# Patient Record
Sex: Female | Born: 1965 | Race: Black or African American | Hispanic: No | Marital: Married | Smoking: Never smoker
Health system: Southern US, Community
[De-identification: ages and names within clinical notes are randomized; demographics above are authoritative.]

## PROBLEM LIST (undated history)

## (undated) DIAGNOSIS — I10 Essential (primary) hypertension: Secondary | ICD-10-CM

## (undated) DIAGNOSIS — E785 Hyperlipidemia, unspecified: Secondary | ICD-10-CM

## (undated) HISTORY — DX: Hyperlipidemia, unspecified: E78.5

---

## 1999-05-29 ENCOUNTER — Other Ambulatory Visit: Admission: RE | Admit: 1999-05-29 | Discharge: 1999-05-29 | Payer: Self-pay | Admitting: Gynecology

## 1999-07-19 ENCOUNTER — Encounter: Payer: Self-pay | Admitting: Emergency Medicine

## 1999-07-19 ENCOUNTER — Emergency Department (HOSPITAL_COMMUNITY): Admission: EM | Admit: 1999-07-19 | Discharge: 1999-07-19 | Payer: Self-pay | Admitting: Emergency Medicine

## 1999-09-18 ENCOUNTER — Other Ambulatory Visit: Admission: RE | Admit: 1999-09-18 | Discharge: 1999-09-18 | Payer: Self-pay | Admitting: Gynecology

## 1999-10-02 ENCOUNTER — Encounter (INDEPENDENT_AMBULATORY_CARE_PROVIDER_SITE_OTHER): Payer: Self-pay | Admitting: Specialist

## 1999-10-02 ENCOUNTER — Other Ambulatory Visit: Admission: RE | Admit: 1999-10-02 | Discharge: 1999-10-02 | Payer: Self-pay | Admitting: Gynecology

## 2000-01-06 ENCOUNTER — Emergency Department (HOSPITAL_COMMUNITY): Admission: EM | Admit: 2000-01-06 | Discharge: 2000-01-06 | Payer: Self-pay | Admitting: *Deleted

## 2000-09-23 ENCOUNTER — Other Ambulatory Visit: Admission: RE | Admit: 2000-09-23 | Discharge: 2000-09-23 | Payer: Self-pay | Admitting: Gynecology

## 2003-10-19 ENCOUNTER — Other Ambulatory Visit: Admission: RE | Admit: 2003-10-19 | Discharge: 2003-10-19 | Payer: Self-pay | Admitting: Gynecology

## 2004-01-24 ENCOUNTER — Ambulatory Visit: Payer: Self-pay | Admitting: Obstetrics and Gynecology

## 2004-01-24 ENCOUNTER — Inpatient Hospital Stay (HOSPITAL_COMMUNITY): Admission: AD | Admit: 2004-01-24 | Discharge: 2004-01-24 | Payer: Self-pay | Admitting: Gynecology

## 2005-10-07 ENCOUNTER — Inpatient Hospital Stay (HOSPITAL_COMMUNITY): Admission: AD | Admit: 2005-10-07 | Discharge: 2005-10-07 | Payer: Self-pay | Admitting: *Deleted

## 2005-11-06 ENCOUNTER — Other Ambulatory Visit: Admission: RE | Admit: 2005-11-06 | Discharge: 2005-11-06 | Payer: Self-pay | Admitting: Obstetrics & Gynecology

## 2005-11-06 ENCOUNTER — Ambulatory Visit: Payer: Self-pay | Admitting: Obstetrics & Gynecology

## 2005-11-07 ENCOUNTER — Encounter (INDEPENDENT_AMBULATORY_CARE_PROVIDER_SITE_OTHER): Payer: Self-pay | Admitting: Specialist

## 2005-12-11 ENCOUNTER — Ambulatory Visit: Payer: Self-pay | Admitting: Obstetrics & Gynecology

## 2005-12-19 ENCOUNTER — Ambulatory Visit (HOSPITAL_COMMUNITY): Admission: RE | Admit: 2005-12-19 | Discharge: 2005-12-19 | Payer: Self-pay | Admitting: Obstetrics & Gynecology

## 2006-02-20 ENCOUNTER — Ambulatory Visit: Payer: Self-pay | Admitting: Obstetrics and Gynecology

## 2006-03-12 ENCOUNTER — Ambulatory Visit: Payer: Self-pay | Admitting: Obstetrics & Gynecology

## 2006-03-13 ENCOUNTER — Encounter: Payer: Self-pay | Admitting: Obstetrics & Gynecology

## 2006-03-26 ENCOUNTER — Ambulatory Visit: Payer: Self-pay | Admitting: Obstetrics & Gynecology

## 2006-07-02 ENCOUNTER — Ambulatory Visit: Payer: Self-pay | Admitting: Obstetrics & Gynecology

## 2006-07-09 ENCOUNTER — Ambulatory Visit (HOSPITAL_COMMUNITY): Admission: RE | Admit: 2006-07-09 | Discharge: 2006-07-09 | Payer: Self-pay | Admitting: Obstetrics & Gynecology

## 2006-08-13 ENCOUNTER — Ambulatory Visit: Payer: Self-pay | Admitting: Obstetrics & Gynecology

## 2009-03-23 ENCOUNTER — Emergency Department (HOSPITAL_COMMUNITY): Admission: EM | Admit: 2009-03-23 | Discharge: 2009-03-23 | Payer: Self-pay | Admitting: Emergency Medicine

## 2009-03-29 ENCOUNTER — Emergency Department (HOSPITAL_COMMUNITY): Admission: EM | Admit: 2009-03-29 | Discharge: 2009-03-29 | Payer: Self-pay | Admitting: Emergency Medicine

## 2010-06-20 ENCOUNTER — Emergency Department (HOSPITAL_COMMUNITY)
Admission: EM | Admit: 2010-06-20 | Discharge: 2010-06-20 | Payer: Self-pay | Source: Home / Self Care | Admitting: Emergency Medicine

## 2010-11-23 NOTE — Group Therapy Note (Signed)
NAMEKAYDENSE, RIZO                 ACCOUNT NO.:  [id number]   MEDICAL RECORD NO.:  [id number]          PATIENT TYPE:  WOC   LOCATION:  [facility]                   FACILITY:  [facility]   PHYSICIAN:  Elsie Lincoln, MD      DATE OF BIRTH:  1966-06-02   DATE OF SERVICE:  12/11/2005                                    CLINIC NOTE   The patient is a 45 year old female who is known to have endometrial  hyperplasia on her endometrial biopsy.  See details of the clinic note in  May for H&P.  She was already starting on Depo-Provera.  This is to be a  cure.  She needs re-sampling in 12 weeks and another shot of Depo-Provera  probably.  She is using this for birth control and control of her bleeding.  She also needs a mammogram, which was ordered today.  When we do her  endometrial biopsy, she also needs a Pap smear, and finally she needs to be  sent home with a stool guaiac kit at her next visit.           ______________________________  Elsie Lincoln, MD     KL/MEDQ  D:  12/11/2005  T:  12/12/2005  Job:  [id number]

## 2010-11-23 NOTE — Group Therapy Note (Signed)
Grace [name], BORRERO NO.:  [id number]   MEDICAL RECORD NO.:  [id number]          PATIENT TYPE:  WOC   LOCATION:  [facility]                   FACILITY:  [facility]   PHYSICIAN:  Dorthula Perfect, MD     DATE OF BIRTH:  1966/06/06   DATE OF SERVICE:                                  CLINIC NOTE   A 45 year old black female, gravida 1, para 1 returns for results.  She  was seen here the day after Christmas with a history of uterine fibroids  and metromenorrhagia.  She was cycled for the last year or two with Depo  Provera in attempt to control her bleeding.  Depo Provera was utilized  03/2006.  Vaginal bleeding could last for at least a month.  Her last  menstrual period started this past Sunday.  She is not on Depo Provera.   Ultrasound on 07/09/2006 showed a uterus of normal size.  Endometrial  was within normal limits.  There were at least 3 uterine fibroids near  the fundus.  One was a broad-based subserosal, measured 3.5 cm.  The  other 2 were anterior myometrial to the right of the midline and  measured 3.6 cm and 2.4 cm respectively.  The ovaries were normal.   The results of the ultrasound are explained to the patient.  Because  these fibroids are not particularly large and are not intramural, I  believe we can possibly help her with cyclic Provera; therefore, she is  given a prescription for Provera 10 mg, tabs 30 with the instructions to  take 1 a day for 10 days beginning on day 14 of each cycle.  She will be  rechecked again in 4 months.  If the bleeding continues to be a problem,  a hysterectomy will certainly have to be considered.           ______________________________  Dorthula Perfect, MD     ER/MEDQ  D:  08/13/2006  T:  08/13/2006  Job:  [id number]

## 2010-11-23 NOTE — Group Therapy Note (Signed)
Grace [name], Grace [name]                 ACCOUNT NO.:  [id number]   MEDICAL RECORD NO.:  [id number]          PATIENT TYPE:  WOC   LOCATION[facility]                   FACILITY:  [facility]   PHYSICIAN:  Elsie Lincoln, MD      DATE OF BIRTH:  03/16/1966   DATE OF SERVICE:  11/06/2005                                    CLINIC NOTE   HISTORY OF PRESENT ILLNESS:  The patient is a 45 year old G1, P39 female, LMP  October 08, 2005, who presents for abnormal uterine bleeding.  The patient was  seen last year by Dr. Chevis Pretty for the same problem.  She has a known fibroid  uterus.  We do not have the ultrasound, as it was done in his office.  She  has had several years of abnormal uterine bleeding where she will have 1  month of bleeding, then it stops.  She showed up at the [facility] on October 07, 2005  and found to have a hemoglobin of 8.6; she was started on ibuprofen and iron  and then followed up in the GYN clinic.  The patient has not bled since.  The patient has been on Provera in the past for these episodes of bleeding,  but has never had anything else.  Dr. Okey Dupre has attempted to start her on  birth control pills in the past; however, she refused.  She has never had an  endometrial biopsy.   PAST MEDICAL HISTORY:  Denies.   PAST SURGICAL HISTORY:  Denies.   GYNECOLOGICAL HISTORY:  NSVD x1.  She is sexually active and monogamous.  No  history of abnormal Pap smear.  No history of ovarian cyst.  Positive  history of fibroids.   ALLERGIES:  None.   MEDICATIONS:  Iron.   REVIEW OF SYSTEMS:  Positive for frequent headaches, dizzy spells, not  flashes and pain with intercourse occasionally.   SOCIAL HISTORY:  The patient is a CNA.  She smokes occasional marijuana.  She has never been physically or sexually abused.  She drinks 2 alcoholic  beverages a week and does not smoke cigarettes.  There is no history of  blood clots in her family.  Her grandmother has had lung and colon cancer.  Her mother also has  diabetes and her sister and grandmother have high blood  pressure.   PHYSICAL EXAMINATION:  PELVIC:  Tanner V.  Vulva:  No lesion.  Urethra  nontender.  Bladder nontender.  Cervix nontender.  Uterus:  Posterior  fibroid, slightly enlarged, nontender, anteverted.  Adnexa:  No masses,  nontender.   ASSESSMENT AND PLAN:  [age]-year-old female with abnormal uterine bleeding.   1.  Endometrial biopsy.  Uterus sounded to 8.5 to 9 cm.  2.  FSH today.  3.  Pap smear next visit.  4.  Patient needs mammogram, will order next visit.  5.  Patient given options for birth control pills versus Depo-Provera and      patient chooses Depo-Provera.  6.  Return to clinic in 12 weeks to see how she is doing.  7.  Get release of information from Dr. Chevis Pretty.  ______________________________  Elsie Lincoln, MD     KL/MEDQ  D:  11/06/2005  T:  11/07/2005  Job:  [id number]

## 2010-11-23 NOTE — Group Therapy Note (Signed)
NAMEMARYANA, [name]                 ACCOUNT NO.:  [id number]   MEDICAL RECORD NO.:  [id number]          PATIENT TYPE:  WOC   LOCATION:  [facility]                   FACILITY:  [facility]   PHYSICIAN:  Elsie Lincoln, MD      DATE OF BIRTH:  1966-03-19   DATE OF SERVICE:  03/12/2006                                    CLINIC NOTE   The patient is a 45 year old female who presents for repeat endometrial  biopsy, Pap smear and Depo.  She has previous history of endometrial  hyperplasia and was treated with Depo Provera.  She is here for repeat  endometrial biopsy and also needs another Depo Provera shot which she uses  for menorrhagia and fibroids.  She has had a lot of bleeding on the Depo  Provera system, may not be the best method for her birth control if the  endometrial hyperplasia  is truly resolved.  She has had a mammogram which  is negative and she has guaiac stool kit sent home.  Biopsy was performed  under sterile technique, uterus sounded to 8 cm, two passes taken.  Pap  smear was also done.  The patient is to come back in two weeks for results  and plans.           ______________________________  Elsie Lincoln, MD     KL/MEDQ  D:  03/12/2006  T:  03/13/2006  Job:  [id number]

## 2010-11-23 NOTE — Group Therapy Note (Signed)
Grace [name], Grace [name]                 ACCOUNT NO.:  [id number]   MEDICAL RECORD NO.:  [id number]          PATIENT TYPE:  WOC   LOCATION:  [facility]                   [facility]ACILITY:  [facility]   PHYSICIAN:  Dorthula Perfect, MD     DATE OF BIRTH:  1965/09/03   DATE OF SERVICE:  07/02/2006                                  CLINIC NOTE   This is a 45 year old gravida 1, para 1 has been followed this past year  with abnormal uterine bleeding.  She has been receiving Depo- Provera  every 3 months for the last year or two in an attempt to control the  bleeding.  She was seen May 2007 by Dr. Penne Lash.  Endometrial biopsy  shows simple endometrial hyperplasia without atypia.  At that time of  that endometrial biopsy the uterus sounded 8.5 to 9 cm.  She has seen  Dr. Chevis Pretty a few years ago and that is the last time that she had an  ultrasound done.  She was seen by myself August 16 for followup visit  and a repeat endometrial biopsy was done.  This revealed benign  disordered proliferative pattern.  She is here for repeat Depo-Provera.   Her previous menstrual period was October 22 until Thanksgiving.  She  started again December 13 and bleed up until just a couple of days ago.  The longest time of having vaginal bleeding would be a month and the  longest interval between the bleeding would be perhaps a few weeks.  She  uses pads.  She changes them at least 5 times a day.  She states that  the bleeding is having enough to stain her underwear and on occasion the  bed sheets.   Her only child is 74 years old and she has done nothing for birth  control ever since that time.  Certainly while on Depo-Provera she has  contraception.  When she has her bleeding she does not have any pain or  cramping.   PHYSICAL EXAMINATION:  Height 5 foot 2 inches.  Weight 171 pounds.  Blood pressure 121/76.  ABDOMEN:  Soft and nontender.  No masses are felt.  PELVIC:  External genitalia and BUS glands are normal.  Vagina well  epithelialized as is the cervix.  She is having spotting at this time.  Uterus is thought to be upper limits of normal size and no specific  uterine fibroids are noted.  Adnexal areas are normal.   IMPRESSION:  1. Uterine fibroids - by history.  2. Metromenorrhagia   DISPOSITION:  1. Ultrasound.  2. Return in 2 weeks for results.  3. Stop the Depo-Provera.  4. Consider at the next visit utilizing Provera to cycle her and      perhaps to get her to be cyclic.  I am a little      hesitant about starting oral contraception on her, but that is      another option, and of course the ultimate option if nothing else      helps would by hysterectomy.           ______________________________  Dorthula Perfect,  MD     ER/MEDQ  D:  07/02/2006  T:  07/02/2006  Job:  [id number]

## 2011-06-27 ENCOUNTER — Ambulatory Visit: Payer: Self-pay | Admitting: Obstetrics & Gynecology

## 2011-10-28 ENCOUNTER — Encounter (HOSPITAL_COMMUNITY): Payer: Self-pay

## 2011-10-28 ENCOUNTER — Emergency Department (HOSPITAL_COMMUNITY)
Admission: EM | Admit: 2011-10-28 | Discharge: 2011-10-28 | Disposition: A | Discharge status: Home / Self Care | Payer: Managed Care, Other (non HMO) | Source: Home / Self Care | Attending: Family Medicine | Admitting: Family Medicine

## 2011-10-28 DIAGNOSIS — T148XXA Other injury of unspecified body region, initial encounter: Secondary | ICD-10-CM

## 2011-10-28 DIAGNOSIS — S80869A Insect bite (nonvenomous), unspecified lower leg, initial encounter: Secondary | ICD-10-CM

## 2011-10-28 DIAGNOSIS — W57XXXA Bitten or stung by nonvenomous insect and other nonvenomous arthropods, initial encounter: Secondary | ICD-10-CM

## 2011-10-28 DIAGNOSIS — S90569A Insect bite (nonvenomous), unspecified ankle, initial encounter: Secondary | ICD-10-CM

## 2011-10-28 MED ORDER — FLUTICASONE PROPIONATE 0.05 % EX CREA: TOPICAL_CREAM | Freq: Two times a day (BID) | CUTANEOUS | Status: AC

## 2011-10-28 NOTE — Discharge Instructions (Signed)
Use cream as needed until rash resolves, return as needed.

## 2011-10-28 NOTE — ED Notes (Signed)
Pt states something bit her on the rt lower leg on Friday.  States area is red and itchy.  Reports the swelling and redness is better.

## 2011-10-28 NOTE — ED Provider Notes (Signed)
History     CSN: [id number]  Arrival date & time 10/28/11  1738   First MD Initiated Contact with Patient 10/28/11 1748      Chief Complaint  Patient presents with  . Insect Bite    (Consider location/radiation/quality/duration/timing/severity/associated sxs/prior treatment) Patient is a 46 y.o. female presenting with rash. The history is provided by the patient.  Rash  This is a new problem. The current episode started 2 days ago. The problem has not changed since onset.The problem is associated with an insect bite/sting. There has been no fever. The rash is present on the right lower leg. The patient is experiencing no pain. Associated symptoms include itching.    History reviewed. No pertinent past medical history.  History reviewed. No pertinent past surgical history.  No family history on file.  History  Substance Use Topics  . Smoking status: Never Smoker   . Smokeless tobacco: Not on file  . Alcohol Use: Yes    OB History    Grav Para Term Preterm Abortions TAB SAB Ect Mult Living                  Review of Systems  Constitutional: Negative.   Skin: Positive for itching and rash.    Allergies  Review of patient's allergies indicates no known allergies.  Home Medications   Current Outpatient Rx  Name Route Sig Dispense Refill  . FLUTICASONE PROPIONATE 0.05 % EX CREA Topical Apply topically 2 (two) times daily. 30 g 0    BP 170/88  Pulse 74  Temp(Src) 98.5 F (36.9 C) (Oral)  Resp 16  SpO2 99%  LMP 09/19/2011  Physical Exam  Nursing note and vitals reviewed. Constitutional: She appears well-developed and well-nourished.  Skin: Skin is warm and dry. Rash noted.       Erythematous patch on right calf with central clear blister, pruritic, nonpustular.     ED Course  Procedures (including critical care time)  Labs Reviewed - No data to display No results found.   1. Insect bite of lower extremity       MDM          Linna Hoff, MD 10/28/11 [phone]

## 2012-01-06 ENCOUNTER — Ambulatory Visit: Payer: Managed Care, Other (non HMO) | Admitting: Obstetrics and Gynecology

## 2012-04-27 ENCOUNTER — Other Ambulatory Visit: Payer: Self-pay | Admitting: Obstetrics and Gynecology

## 2012-04-27 ENCOUNTER — Other Ambulatory Visit (HOSPITAL_COMMUNITY)
Admission: RE | Admit: 2012-04-27 | Discharge: 2012-04-27 | Disposition: A | Discharge status: Home / Self Care | Payer: Managed Care, Other (non HMO) | Source: Ambulatory Visit | Attending: Obstetrics and Gynecology | Admitting: Obstetrics and Gynecology

## 2012-04-27 DIAGNOSIS — Z1151 Encounter for screening for human papillomavirus (HPV): Secondary | ICD-10-CM | POA: Insufficient documentation

## 2012-04-27 DIAGNOSIS — Z124 Encounter for screening for malignant neoplasm of cervix: Secondary | ICD-10-CM | POA: Insufficient documentation

## 2012-04-27 DIAGNOSIS — Z1231 Encounter for screening mammogram for malignant neoplasm of breast: Secondary | ICD-10-CM

## 2012-05-22 ENCOUNTER — Ambulatory Visit: Payer: Managed Care, Other (non HMO)

## 2012-05-26 ENCOUNTER — Encounter (HOSPITAL_COMMUNITY): Payer: Self-pay | Admitting: Emergency Medicine

## 2012-05-26 ENCOUNTER — Emergency Department (HOSPITAL_COMMUNITY)
Admission: EM | Admit: 2012-05-26 | Discharge: 2012-05-26 | Disposition: A | Discharge status: Home / Self Care | Payer: Managed Care, Other (non HMO) | Source: Home / Self Care | Attending: Emergency Medicine | Admitting: Emergency Medicine

## 2012-05-26 DIAGNOSIS — S335XXA Sprain of ligaments of lumbar spine, initial encounter: Secondary | ICD-10-CM

## 2012-05-26 DIAGNOSIS — S39012A Strain of muscle, fascia and tendon of lower back, initial encounter: Secondary | ICD-10-CM

## 2012-05-26 HISTORY — DX: Essential (primary) hypertension: I10

## 2012-05-26 MED ORDER — MELOXICAM 15 MG PO TABS: 15.0000 mg | ORAL_TABLET | Freq: Every day | ORAL | Status: DC

## 2012-05-26 MED ORDER — METHOCARBAMOL 500 MG PO TABS: 500.0000 mg | ORAL_TABLET | Freq: Three times a day (TID) | ORAL | Status: DC

## 2012-05-26 MED ORDER — TRAMADOL HCL 50 MG PO TABS: 100.0000 mg | ORAL_TABLET | Freq: Three times a day (TID) | ORAL | Status: DC | PRN

## 2012-05-26 NOTE — ED Provider Notes (Signed)
Chief Complaint  Patient presents with  . 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 This past Sunday, 3 days ago, she bent down at home and experienced sudden onset of right flank pain without radiation. The pain is worse in the morning and the evening, or with any type of movement, twisting, bending and even if she takes a deep breath. She denies any numbness or tingling in the lower extremities or bladder or bowel complaints. She denies any abdominal pain, fever, chills, headache, stiff neck, or unintentional weight loss. She has no history of cancer or osteoporosis.  Review of Systems:  Other than noted above, the patient denies any of the following symptoms: Systemic:  No fever, chills, severe fatigue, or unexplained weight loss. GI:  No abdominal pain, nausea, vomiting, diarrhea, constipation, incontinence of bowel, or blood in stool. GU:  No dysuria, frequency, urgency, or hematuria. No incontinence of urine or difficulty urinating.  M-S:  No neck pain, joint pain, arthritis, or myalgias. Neuro:  No paresthesias, saddle anesthesia, muscular weakness, or progressive neurological deficit.  PMFSH:  Past medical history, family history, social history, meds, and allergies were reviewed. Specifically, there is no history of cancer, major trauma, osteoporosis, immunosuppression, HIV, or IV or injection drug use.   Physical Exam:   Vital signs:  BP 151/97  Pulse 64  Temp 97.9 F (36.6 C) (Oral)  Resp 18  SpO2 100%  LMP 05/20/2012 General:  Alert, oriented, in no distress. Abdomen:  Soft, non-tender.  No organomegaly or mass.  No pulsatile midline abdominal mass or bruit. Back:  Exam of the lower back reveals slight pain to palpation in the right CVA area. Her back has a limited range of motion with 45 of flexion, 10 of extension, 20 of lateral bending in each direction, and 60 of rotation with pain.  Straight leg raising is negative bilaterally. Neuro:  Normal muscle strength, sensations and DTRs. Extremities: Pedal pulses were full, there was no edema. Skin:  Clear, warm and dry.  No rash.  Assessment:  The encounter diagnosis was Lumbar strain.  Plan:   1.  The following meds were prescribed:   New Prescriptions   MELOXICAM (MOBIC) 15 MG TABLET    Take 1 tablet (15 mg total) by mouth Grace [name].   METHOCARBAMOL (ROBAXIN) 500 MG TABLET    Take 1 tablet (500 mg total) by mouth 3 (three) times Grace [name].   TRAMADOL (ULTRAM) 50 MG TABLET    Take 2 tablets (100 mg total) by mouth every 8 (eight) hours as needed for pain.   2.  The patient was instructed in symptomatic care and handouts were given. 3.  The patient was told to return if becoming worse in any way, if no better in 2 weeks, and given some red flag symptoms that would indicate earlier return. 4.  The patient was encouraged to try to be as active as possible and given some exercises to do followed by moist heat.    Reuben Likes, MD 05/26/12 [phone]

## 2012-05-26 NOTE — ED Notes (Signed)
3 days ago pt states she bent down to pick something up and felt pain in the right  Mid  back area.  The pain is worse at night and in the mornings.   She denies dysuria/fever/chills

## 2012-06-10 NOTE — ED Notes (Signed)
RTW note discussion

## 2012-07-03 ENCOUNTER — Ambulatory Visit
Admission: RE | Admit: 2012-07-03 | Discharge: 2012-07-03 | Disposition: A | Discharge status: Home / Self Care | Payer: Managed Care, Other (non HMO) | Source: Ambulatory Visit | Attending: Obstetrics and Gynecology | Admitting: Obstetrics and Gynecology

## 2012-07-03 DIAGNOSIS — Z1231 Encounter for screening mammogram for malignant neoplasm of breast: Secondary | ICD-10-CM

## 2013-08-12 ENCOUNTER — Other Ambulatory Visit: Payer: Self-pay

## 2013-08-12 DIAGNOSIS — Z1231 Encounter for screening mammogram for malignant neoplasm of breast: Secondary | ICD-10-CM

## 2013-08-23 ENCOUNTER — Inpatient Hospital Stay: Admission: RE | Admit: 2013-08-23 | Payer: Managed Care, Other (non HMO) | Source: Ambulatory Visit

## 2013-08-23 ENCOUNTER — Ambulatory Visit
Admission: RE | Admit: 2013-08-23 | Discharge: 2013-08-23 | Disposition: A | Discharge status: Home / Self Care | Payer: Managed Care, Other (non HMO) | Source: Ambulatory Visit

## 2013-08-23 DIAGNOSIS — Z1231 Encounter for screening mammogram for malignant neoplasm of breast: Secondary | ICD-10-CM

## 2014-08-04 ENCOUNTER — Other Ambulatory Visit: Payer: Self-pay

## 2014-08-04 DIAGNOSIS — Z1231 Encounter for screening mammogram for malignant neoplasm of breast: Secondary | ICD-10-CM

## 2016-02-26 ENCOUNTER — Emergency Department (HOSPITAL_COMMUNITY): Payer: Self-pay

## 2016-02-26 ENCOUNTER — Encounter (HOSPITAL_COMMUNITY): Payer: Self-pay | Admitting: Emergency Medicine

## 2016-02-26 ENCOUNTER — Emergency Department (HOSPITAL_COMMUNITY)
Admission: EM | Admit: 2016-02-26 | Discharge: 2016-02-27 | Disposition: A | Discharge status: Home / Self Care | Payer: Self-pay | Attending: Physician Assistant | Admitting: Physician Assistant

## 2016-02-26 DIAGNOSIS — I10 Essential (primary) hypertension: Secondary | ICD-10-CM | POA: Insufficient documentation

## 2016-02-26 DIAGNOSIS — R42 Dizziness and giddiness: Secondary | ICD-10-CM | POA: Insufficient documentation

## 2016-02-26 DIAGNOSIS — Z7982 Long term (current) use of aspirin: Secondary | ICD-10-CM | POA: Insufficient documentation

## 2016-02-26 LAB — COMPREHENSIVE METABOLIC PANEL
ALT: 9 U/L — ABNORMAL LOW (ref 14–54)
AST: 18 U/L (ref 15–41)
Albumin: 4.5 g/dL (ref 3.5–5.0)
Alkaline Phosphatase: 52 U/L (ref 38–126)
Anion gap: 8 (ref 5–15)
BUN: 13 mg/dL (ref 6–20)
CO2: 27 mmol/L (ref 22–32)
Calcium: 9.7 mg/dL (ref 8.9–10.3)
Chloride: 106 mmol/L (ref 101–111)
Creatinine, Ser: 0.78 mg/dL (ref 0.44–1.00)
GFR calc Af Amer: >60 mL/min (ref >60)
GFR calc non Af Amer: >60 mL/min (ref >60)
Glucose, Bld: 93 mg/dL (ref 65–99)
Potassium: 3.6 mmol/L (ref 3.5–5.1)
Sodium: 141 mmol/L (ref 135–145)
Total Bilirubin: 0.7 mg/dL (ref 0.3–1.2)
Total Protein: 8.4 g/dL — ABNORMAL HIGH (ref 6.5–8.1)

## 2016-02-26 LAB — CBC WITH DIFFERENTIAL/PLATELET
Basophils Absolute: 0 10*3/uL (ref 0.0–0.1)
Basophils Relative: 0 %
Eosinophils Absolute: 0.1 10*3/uL (ref 0.0–0.7)
Eosinophils Relative: 1 %
HCT: 37.7 % (ref 36.0–46.0)
Hemoglobin: 12.4 g/dL (ref 12.0–15.0)
Lymphocytes Relative: 34 %
Lymphs Abs: 1.9 10*3/uL (ref 0.7–4.0)
MCH: 29.5 pg (ref 26.0–34.0)
MCHC: 32.9 g/dL (ref 30.0–36.0)
MCV: 89.5 fL (ref 78.0–100.0)
Monocytes Absolute: 0.4 10*3/uL (ref 0.1–1.0)
Monocytes Relative: 6 %
Neutro Abs: 3.2 10*3/uL (ref 1.7–7.7)
Neutrophils Relative %: 59 %
Platelets: 353 10*3/uL (ref 150–400)
RBC: 4.21 MIL/uL (ref 3.87–5.11)
RDW: 14.1 % (ref 11.5–15.5)
WBC: 5.5 10*3/uL (ref 4.0–10.5)

## 2016-02-26 LAB — I-STAT TROPONIN, ED: Troponin i, poc: 0 ng/mL (ref 0.00–0.08)

## 2016-02-26 MED ORDER — HYDROCHLOROTHIAZIDE 25 MG PO TABS: 25.0000 mg | ORAL_TABLET | Freq: Every day | ORAL | 0 refills | Status: DC

## 2016-02-26 NOTE — Discharge Instructions (Signed)
Please use the phone number in this packet to call for an appointmetn.  OR you can call the [facility]- they hopefully will be able to get you an appointment.  In the meantime use this medication to help with BP control.

## 2016-02-26 NOTE — ED Provider Notes (Addendum)
[facility] Provider Note   CSN: [id number] Arrival date & time: 02/26/16  2002     History   Chief Complaint Chief Complaint  Patient presents with  . Hypertension    HPI Grace [name] is a 50 y.o. female.  HPI   Patient is a 50 year old female presenting with hypertension. Patient was feeling a little bit lightheaded and went to the right aid couple times and found to have elevated blood pressure. Patient had no chest pain. Patient had no neurologic symptoms. Patient's not seen a physician a number of years.   Past Medical History:  Diagnosis Date  . Hypertension     There are no active problems to display for this patient.   History reviewed. No pertinent surgical history.  OB History    No data available       Home Medications    Prior to Admission medications   Medication Sig Start Date End Date Taking? Authorizing Provider  aspirin-acetaminophen-caffeine (EXCEDRIN MIGRAINE) [phone] MG tablet Take 1 tablet by mouth every 6 (six) hours as needed for headache.   Yes Historical Provider, MD  hydrochlorothiazide (HYDRODIURIL) 25 MG tablet Take 1 tablet (25 mg total) by mouth daily. 02/26/16   Bellatrix Devonshire Lyn Casey Fye, MD  meloxicam (MOBIC) 15 MG tablet Take 1 tablet (15 mg total) by mouth daily. Patient not taking: Reported on 02/26/2016 05/26/12   Reuben Likesavid C Keller, MD  methocarbamol (ROBAXIN) 500 MG tablet Take 1 tablet (500 mg total) by mouth 3 (three) times daily. Patient not taking: Reported on 02/26/2016 05/26/12   Reuben Likesavid C Keller, MD  traMADol (ULTRAM) 50 MG tablet Take 2 tablets (100 mg total) by mouth every 8 (eight) hours as needed for pain. Patient not taking: Reported on 02/26/2016 05/26/12   Reuben Likesavid C Keller, MD    Family History History reviewed. No pertinent family history.  Social History Social History  Substance Use Topics  . Smoking status: Never Smoker  . Smokeless tobacco: Not on file  . Alcohol use Yes     Comment: occasionally      Allergies   Review of patient's allergies indicates no known allergies.   Review of Systems Review of Systems  Constitutional: Negative for activity change.  Respiratory: Negative for shortness of breath.   Cardiovascular: Negative for chest pain.  Gastrointestinal: Negative for abdominal pain.  Neurological: Positive for light-headedness. Negative for headaches.  All other systems reviewed and are negative.    Physical Exam Updated Vital Signs BP 178/94 (BP Location: Right Arm)   Pulse 60   Temp 97.9 F (36.6 C) (Oral)   Resp 18   SpO2 100%   Physical Exam  Constitutional: She is oriented to person, place, and time. She appears well-developed and well-nourished.  HENT:  Head: Normocephalic and atraumatic.  Eyes: Right eye exhibits no discharge.  Cardiovascular: Normal rate.   Pulmonary/Chest: Effort normal. No respiratory distress.  Abdominal: Soft. There is no tenderness.  Musculoskeletal: Normal range of motion. She exhibits no edema.  Neurological: She is oriented to person, place, and time. No cranial nerve deficit.  Skin: Skin is warm and dry. She is not diaphoretic.  Psychiatric: She has a normal mood and affect.  Nursing note and vitals reviewed.    ED Treatments / Results  Labs (all labs ordered are listed, but only abnormal results are displayed) Labs Reviewed  COMPREHENSIVE METABOLIC PANEL - Abnormal; Notable for the following:       Result Value   Total Protein 8.4 (*)  ALT 9 (*)    All other components within normal limits  CBC WITH DIFFERENTIAL/PLATELET  Rosezena SensorI-STAT TROPOININ, ED    EKG  EKG Interpretation  Date/Time:  Monday February 26 2016 22:21:40 EDT Ventricular Rate:  64 PR Interval:    QRS Duration: 161 QT Interval:  487 QTC Calculation: 499 R Axis:   30 Text Interpretation:  Sinus or ectopic atrial rhythm Nonspecific intraventricular conduction delay Repol abnrm suggests ischemia, lateral leads RBBB similar to previous.   Confirmed by Kandis MannanMACKUEN, COURTNEY ([id number]) on 02/26/2016 11:14:51 PM       Radiology Ct Head Wo Contrast  Result Date: 02/26/2016 CLINICAL DATA:  50 y/o F; 3 days of hypertension with lightheadedness. A fall EXAM: CT HEAD WITHOUT CONTRAST TECHNIQUE: Contiguous axial images were obtained from the base of the skull through the vertex without intravenous contrast. COMPARISON:  CT of the head dated 03/29/2009. FINDINGS: Brain: No evidence of acute infarction, hemorrhage, hydrocephalus, extra-axial collection or mass lesion/mass effect. Cavum septum pellucidum. Vascular: No hyperdense vessel or unexpected calcification. Skull: No displaced skull fracture.  No suspicious osseous lesion. Sinuses/Orbits: Minimal mucosal thickening within the sphenoid sinus, otherwise the visualized paranasal sinuses, mastoid air cells, and orbits are unremarkable Other: None. IMPRESSION: No acute intracranial abnormality is identified. No significant interval change. Electronically Signed   By: Mitzi HansenLance  Furusawa-Stratton M.D.   On: 02/26/2016 22:48    Procedures Procedures (including critical care time)  Medications Ordered in ED Medications - No data to display   Initial Impression / Assessment and Plan / ED Course  I have reviewed the triage vital signs and the nursing notes.  Pertinent labs & imaging results that were available during my care of the patient were reviewed by me and considered in my medical decision making (see chart for details).  Clinical Course   Patient is a 50 year old female presenting with mild lightheadedness and hypertension. Patient otherwise is asymptomatic. Patient has no cranial nerve dysfunction. No chest pain. According to ACEP guidelines on asymptomatic hypertension will be able to discharge home with follow-up. We'll start her on a antihypertensive given that she will not have follow-up for next month.   Trop neg, no chest pain.   We'll give a phone number.  To get primary care  physician's appointment in either [facility] or other free clinic.   Patient started on Thiazide given JNC 8 hypertensive guideline   Final Clinical Impressions(s) / ED Diagnoses   Final diagnoses:  Essential hypertension    New Prescriptions New Prescriptions   HYDROCHLOROTHIAZIDE (HYDRODIURIL) 25 MG TABLET    Take 1 tablet (25 mg total) by mouth daily.     Rolf Fells Randall AnLyn Leshawn Straka, MD 02/26/16 2315    Yash Cacciola Randall AnLyn Makia Bossi, MD 02/26/16 [id number]    Abelino Derrickourteney Lyn Eugune Sine, MD 02/26/16 [id number]

## 2016-02-26 NOTE — ED Triage Notes (Signed)
Pt states that she has had hypertension x 3 days. States she does not have a hx of this but felt lightheaded and went to check it at the drugstore. Alert and oriented. Neuro intact.

## 2016-02-26 NOTE — ED Notes (Signed)
PT STS ON Saturday, HER NOSE BEGAN TO BLEED FOR APPROX 15 MIN.

## 2016-03-01 ENCOUNTER — Ambulatory Visit: Payer: Managed Care, Other (non HMO) | Attending: Internal Medicine | Admitting: Physician Assistant

## 2016-03-01 VITALS — BP 150/104 | HR 118 | Temp 98.1°F | Resp 16 | Wt 153.8 lb

## 2016-03-01 DIAGNOSIS — I10 Essential (primary) hypertension: Secondary | ICD-10-CM | POA: Diagnosis not present

## 2016-03-01 MED ORDER — HYDROCHLOROTHIAZIDE 25 MG PO TABS: 25.0000 mg | ORAL_TABLET | Freq: Every day | ORAL | 3 refills | Status: DC

## 2016-03-01 MED ORDER — ASPIRIN EC 81 MG PO TBEC: DELAYED_RELEASE_TABLET | ORAL | 1 refills | Status: DC

## 2016-03-01 NOTE — Progress Notes (Signed)
Patient ID: PATRA GHERARDI, female   DOB: 12/16/1965, 50 y.o.   MRN: [id number]   Lexxie Winberg, is a 50 y.o. female  BMW:[id number]  UVO:[id number]  DOB - [birth year]  Subjective:  Chief Complaint and HPI: Yannis Gumbs is a 50 y.o. female here today to establish care and for a follow up visit afetr being seen in the ED for uncontrolled htn.  She had not been to a doctor in years when she started checking her BP a few weeks ago when feeling lightheaded at the drug store and noticed it was elevated then decided to seek treatment in the ED.  She denies S/Sx related to the elevated BP now.  Upon chart review, it is of note that it looks as though she has had untreated htn for years.  This is her first time to be started on meds and was started on HCTZ at the ED visit.  ED/Hospital notes, labs, EKG reviewed.  Cardiac w/up negative for acute ischemia.   ROS:   Constitutional:  No f/c, No night sweats, No unexplained weight loss. EENT:  No vision changes, No blurry vision, No hearing changes. No mouth, throat, or ear problems.  Respiratory: No cough, No SOB Cardiac: No CP, no palpitations GI:  No abd pain, No N/V/D. GU: No Urinary s/sx Musculoskeletal: No joint pain Neuro: No headache, no dizziness, no motor weakness.  Skin: No rash Endocrine:  No polydipsia. No polyuria.  Psych: Denies SI/HI  No problems updated.  ALLERGIES: No Known Allergies  PAST MEDICAL HISTORY: Past Medical History:  Diagnosis Date  . Hypertension     MEDICATIONS AT HOME: Prior to Admission medications   Medication Sig Start Date End Date Taking? Authorizing Provider  aspirin EC 81 MG tablet Take 1 every other day 03/01/16   Anders Simmonds, PA-C  aspirin-acetaminophen-caffeine (EXCEDRIN MIGRAINE) [phone] MG tablet Take 1 tablet by mouth every 6 (six) hours as needed for headache.    Historical Provider, MD  hydrochlorothiazide (HYDRODIURIL) 25 MG tablet Take 1 tablet (25 mg total) by mouth daily. 03/01/16    Anders Simmonds, PA-C     Objective:  EXAM:   Vitals:   03/01/16 1622 03/01/16 1651  BP: (!) 154/110 (!) 150/104  Pulse: (!) 118   Resp: 16   Temp: 98.1 F (36.7 C)   SpO2: 98%   Weight: 153 lb 12.8 oz (69.8 kg)     General appearance : A&OX3. NAD. Non-toxic-appearing HEENT: Atraumatic and Normocephalic.  PERRLA. EOM intact.  TM clear B. Mouth-MMM, post pharynx WNL w/o erythema, No PND. Neck: supple, no JVD. No cervical lymphadenopathy. No thyromegaly Chest/Lungs:  Breathing-non-labored, Good air entry bilaterally, breath sounds normal without rales, rhonchi, or wheezing  CVS: S1 S2 regular, no murmurs, gallops, rubs  Extremities: Bilateral Lower Ext shows no edema, both legs are warm to touch with = pulse throughout Neurology:  CN II-XII grossly intact, Non focal.   Psych:  TP linear. J/I WNL. Normal speech. Appropriate eye contact and affect.  Skin:  No Rash  Data Review No results found for: HGBA1C   Assessment & Plan   1. Essential hypertension Uncontrolled but only on meds X 4 days; continue- hydrochlorothiazide (HYDRODIURIL) 25 MG tablet; Take 1 tablet (25 mg total) by mouth daily.  Dispense: 30 tablet; Refill: 3 Check BP daily and record.  Bring readings to next visit.  Take 81mg  aspirin every other day DASH diet. Patient have been counseled extensively about nutrition and exercise  Return in  about 2 weeks (around 03/15/2016) for establish with PCP and recheck BP.  The patient was given clear instructions to go to ER or return to medical center if symptoms don't improve, worsen or new problems develop. The patient verbalized understanding. The patient was told to call to get lab results if they haven't heard anything in the next week.     Georgian CoAngela McClung, PA-C [facility] [place], [place] [phone]   03/01/2016, 6:27 PM

## 2016-03-01 NOTE — Progress Notes (Signed)
Pt is in the office today for ed follow up blood pressure

## 2016-03-01 NOTE — Patient Instructions (Addendum)
Check BP daily and record.  Bring readings to next visit.   Take 81mg  aspirin every other day  DASH Eating Plan DASH stands for "Dietary Approaches to Stop Hypertension." The DASH eating plan is a healthy eating plan that has been shown to reduce high blood pressure (hypertension). Additional health benefits may include reducing the risk of type 2 diabetes mellitus, heart disease, and stroke. The DASH eating plan may also help with weight loss. WHAT DO I NEED TO KNOW ABOUT THE DASH EATING PLAN? For the DASH eating plan, you will follow these general guidelines:  Choose foods with a percent daily value for sodium of less than 5% (as listed on the food label).  Use salt-free seasonings or herbs instead of table salt or sea salt.  Check with your health care provider or pharmacist before using salt substitutes.  Eat lower-sodium products, often labeled as "lower sodium" or "no salt added."  Eat fresh foods.  Eat more vegetables, fruits, and low-fat dairy products.  Choose whole grains. Look for the word "whole" as the first word in the ingredient list.  Choose fish and skinless chicken or [place] more often than red meat. Limit fish, poultry, and meat to 6 oz (170 g) each day.  Limit sweets, desserts, sugars, and sugary drinks.  Choose heart-healthy fats.  Limit cheese to 1 oz (28 g) per day.  Eat more home-cooked food and less restaurant, buffet, and fast food.  Limit fried foods.  Cook foods using methods other than frying.  Limit canned vegetables. If you do use them, rinse them well to decrease the sodium.  When eating at a restaurant, ask that your food be prepared with less salt, or no salt if possible. WHAT FOODS CAN I EAT? Seek help from a dietitian for individual calorie needs. Grains Whole grain or whole wheat bread. Brown rice. Whole grain or whole wheat pasta. Quinoa, bulgur, and whole grain cereals. Low-sodium cereals. Corn or whole wheat flour tortillas. Whole  grain cornbread. Whole grain crackers. Low-sodium crackers. Vegetables Fresh or frozen vegetables (raw, steamed, roasted, or grilled). Low-sodium or reduced-sodium tomato and vegetable juices. Low-sodium or reduced-sodium tomato sauce and paste. Low-sodium or reduced-sodium canned vegetables.  Fruits All fresh, canned (in natural juice), or frozen fruits. Meat and Other Protein Products Ground beef (85% or leaner), grass-fed beef, or beef trimmed of fat. Skinless chicken or [place]. Ground chicken or [place]. Pork trimmed of fat. All fish and seafood. Eggs. Dried beans, peas, or lentils. Unsalted nuts and seeds. Unsalted canned beans. Dairy Low-fat dairy products, such as skim or 1% milk, 2% or reduced-fat cheeses, low-fat ricotta or cottage cheese, or plain low-fat yogurt. Low-sodium or reduced-sodium cheeses. Fats and Oils Tub margarines without trans fats. Light or reduced-fat mayonnaise and salad dressings (reduced sodium). Avocado. Safflower, olive, or canola oils. Natural peanut or almond butter. Other Unsalted popcorn and pretzels. The items listed above may not be a complete list of recommended foods or beverages. Contact your dietitian for more options. WHAT FOODS ARE NOT RECOMMENDED? Grains White bread. White pasta. White rice. Refined cornbread. Bagels and croissants. Crackers that contain trans fat. Vegetables Creamed or fried vegetables. Vegetables in a cheese sauce. Regular canned vegetables. Regular canned tomato sauce and paste. Regular tomato and vegetable juices. Fruits Dried fruits. Canned fruit in light or heavy syrup. Fruit juice. Meat and Other Protein Products Fatty cuts of meat. Ribs, chicken wings, bacon, sausage, bologna, salami, chitterlings, fatback, hot dogs, bratwurst, and packaged luncheon meats. Salted nuts and  seeds. Canned beans with salt. Dairy Whole or 2% milk, cream, half-and-half, and cream cheese. Whole-fat or sweetened yogurt. Full-fat cheeses or blue  cheese. Nondairy creamers and whipped toppings. Processed cheese, cheese spreads, or cheese curds. Condiments Onion and garlic salt, seasoned salt, table salt, and sea salt. Canned and packaged gravies. Worcestershire sauce. Tartar sauce. Barbecue sauce. Teriyaki sauce. Soy sauce, including reduced sodium. Steak sauce. Fish sauce. Oyster sauce. Cocktail sauce. Horseradish. Ketchup and mustard. Meat flavorings and tenderizers. Bouillon cubes. Hot sauce. Tabasco sauce. Marinades. Taco seasonings. Relishes. Fats and Oils Butter, stick margarine, lard, shortening, ghee, and bacon fat. Coconut, palm kernel, or palm oils. Regular salad dressings. Other Pickles and olives. Salted popcorn and pretzels. The items listed above may not be a complete list of foods and beverages to avoid. Contact your dietitian for more information. WHERE CAN I FIND MORE INFORMATION? National Heart, Lung, and Blood Institute: Cable[employer].itwww.nhlbi.nih.gov/health/health-topics/topics/dash/   This information is not intended to replace advice given to you by your health care provider. Make sure you discuss any questions you have with your health care provider.   Document Released: 06/13/2011 Document Revised: 07/15/2014 Document Reviewed: 04/28/2013 Elsevier Interactive Patient Education [employer]2016 Elsevier Inc.

## 2016-03-25 ENCOUNTER — Ambulatory Visit: Payer: Managed Care, Other (non HMO) | Attending: Family Medicine | Admitting: Family Medicine

## 2016-03-25 ENCOUNTER — Encounter: Payer: Self-pay | Admitting: Family Medicine

## 2016-03-25 DIAGNOSIS — I10 Essential (primary) hypertension: Secondary | ICD-10-CM | POA: Diagnosis not present

## 2016-03-25 NOTE — Progress Notes (Signed)
   Subjective:  Patient ID: Grace [name], female    DOB: 10/24/1965  Age: 50 y.o. MRN: [id number]  CC: Establish Care   HPI Grace [name] is a 50 year old female with a history of hypertension who comes into the clinic for a follow-up visit. She has been compliant with hydrochlorothiazide and OTC aspirin. Concerned when she checked her blood pressure at home it was in the 150s systolic but blood pressure in clinic is controlled today.  Past Medical History:  Diagnosis Date  . Hypertension     History reviewed. No pertinent surgical history.    Past Medical History:  Diagnosis Date  . Hypertension     History reviewed. No pertinent surgical history.   Outpatient Medications Prior to Visit  Medication Sig Dispense Refill  . aspirin EC 81 MG tablet Take 1 every other day 90 tablet 1  . aspirin-acetaminophen-caffeine (EXCEDRIN MIGRAINE) 250-250-65 MG tablet Take 1 tablet by mouth every 6 (six) hours as needed for headache.    . hydrochlorothiazide (HYDRODIURIL) 25 MG tablet Take 1 tablet (25 mg total) by mouth daily. 30 tablet 3   No facility-administered medications prior to visit.     ROS Review of Systems  Constitutional: Negative for activity change, appetite change and fatigue.  HENT: Negative for congestion, sinus pressure and sore throat.   Eyes: Negative for visual disturbance.  Respiratory: Negative for cough, chest tightness, shortness of breath and wheezing.   Cardiovascular: Negative for chest pain and palpitations.  Gastrointestinal: Negative for abdominal distention, abdominal pain and constipation.  Endocrine: Negative for polydipsia.  Genitourinary: Negative for dysuria and frequency.  Musculoskeletal: Negative for arthralgias and back pain.  Skin: Negative for rash.  Neurological: Negative for tremors, light-headedness and numbness.  Hematological: Does not bruise/bleed easily.  Psychiatric/Behavioral: Negative for agitation and behavioral problems.     Objective:  BP 128/78 (BP Location: Right Arm, Patient Position: Sitting, Cuff Size: Normal)   Pulse 96   Temp 98.2 F (36.8 C) (Oral)   Resp 18   Ht 5\' 2"  (1.575 m)   Wt 155 lb 12.8 oz (70.7 kg)   SpO2 98%   BMI 28.50 kg/m   BP/Weight 03/25/2016 03/01/2016 02/26/2016  Systolic BP 128 150 178  Diastolic BP 78 104 94  Wt. (Lbs) 155.8 153.8 -  BMI 28.5 - -      Physical Exam  Constitutional: She is oriented to person, place, and time. She appears well-developed and well-nourished.  Cardiovascular: Normal rate, normal heart sounds and intact distal pulses.   No murmur heard. Pulmonary/Chest: Effort normal and breath sounds normal. She has no wheezes. She has no rales. She exhibits no tenderness.  Abdominal: Soft. Bowel sounds are normal. She exhibits no distension and no mass. There is no tenderness.  Musculoskeletal: Normal range of motion.  Neurological: She is alert and oriented to person, place, and time.     Assessment & Plan:   1. Essential hypertension Controlled Continue hydrochlorothiazide We'll send off CMET to check for hypokalemia - COMPLETE METABOLIC PANEL WITH GFR; Future - Lipid panel; Future   No orders of the defined types were placed in this encounter.   Follow-up: Return in about 1 month (around 04/24/2016) for complete physical exam.   Jaclyn ShaggyEnobong Amao MD

## 2016-03-25 NOTE — Patient Instructions (Signed)
Hypertension Hypertension, commonly called high blood pressure, is when the force of blood pumping through your arteries is too strong. Your arteries are the blood vessels that carry blood from your heart throughout your body. A blood pressure reading consists of a higher number over a lower number, such as 110/72. The higher number (systolic) is the pressure inside your arteries when your heart pumps. The lower number (diastolic) is the pressure inside your arteries when your heart relaxes. Ideally you want your blood pressure below 120/80. Hypertension forces your heart to work harder to pump blood. Your arteries may become narrow or stiff. Having untreated or uncontrolled hypertension can cause heart attack, stroke, kidney disease, and other problems. RISK FACTORS Some risk factors for high blood pressure are controllable. Others are not.  Risk factors you cannot control include:   Race. You may be at higher risk if you are African American.  Age. Risk increases with age.  Gender. Men are at higher risk than women before age 45 years. After age 65, women are at higher risk than men. Risk factors you can control include:  Not getting enough exercise or physical activity.  Being overweight.  Getting too much fat, sugar, calories, or salt in your diet.  Drinking too much alcohol. SIGNS AND SYMPTOMS Hypertension does not usually cause signs or symptoms. Extremely high blood pressure (hypertensive crisis) may cause headache, anxiety, shortness of breath, and nosebleed. DIAGNOSIS To check if you have hypertension, your health care provider will measure your blood pressure while you are seated, with your arm held at the level of your heart. It should be measured at least twice using the same arm. Certain conditions can cause a difference in blood pressure between your right and left arms. A blood pressure reading that is higher than normal on one occasion does not mean that you need treatment. If  it is not clear whether you have high blood pressure, you may be asked to return on a different day to have your blood pressure checked again. Or, you may be asked to monitor your blood pressure at home for 1 or more weeks. TREATMENT Treating high blood pressure includes making lifestyle changes and possibly taking medicine. Living a healthy lifestyle can help lower high blood pressure. You may need to change some of your habits. Lifestyle changes may include:  Following the DASH diet. This diet is high in fruits, vegetables, and whole grains. It is low in salt, red meat, and added sugars.  Keep your sodium intake below 2,300 mg per day.  Getting at least 30-45 minutes of aerobic exercise at least 4 times per week.  Losing weight if necessary.  Not smoking.  Limiting alcoholic beverages.  Learning ways to reduce stress. Your health care provider may prescribe medicine if lifestyle changes are not enough to get your blood pressure under control, and if one of the following is true:  You are 18-59 years of age and your systolic blood pressure is above 140.  You are 60 years of age or older, and your systolic blood pressure is above 150.  Your diastolic blood pressure is above 90.  You have diabetes, and your systolic blood pressure is over 140 or your diastolic blood pressure is over 90.  You have kidney disease and your blood pressure is above 140/90.  You have heart disease and your blood pressure is above 140/90. Your personal target blood pressure may vary depending on your medical conditions, your age, and other factors. HOME CARE INSTRUCTIONS    Have your blood pressure rechecked as directed by your health care provider.   Take medicines only as directed by your health care provider. Follow the directions carefully. Blood pressure medicines must be taken as prescribed. The medicine does not work as well when you skip doses. Skipping doses also puts you at risk for  problems.  Do not smoke.   Monitor your blood pressure at home as directed by your health care provider. SEEK MEDICAL CARE IF:   You think you are having a reaction to medicines taken.  You have recurrent headaches or feel dizzy.  You have swelling in your ankles.  You have trouble with your vision. SEEK IMMEDIATE MEDICAL CARE IF:  You develop a severe headache or confusion.  You have unusual weakness, numbness, or feel faint.  You have severe chest or abdominal pain.  You vomit repeatedly.  You have trouble breathing. MAKE SURE YOU:   Understand these instructions.  Will watch your condition.  Will get help right away if you are not doing well or get worse.   This information is not intended to replace advice given to you by your health care provider. Make sure you discuss any questions you have with your health care provider.   Document Released: 06/24/2005 Document Revised: 11/08/2014 Document Reviewed: 04/16/2013 Elsevier Interactive Patient Education 2016 Elsevier Inc.  

## 2016-03-25 NOTE — Progress Notes (Signed)
Patient is here to establish care and HTN  Patient denies pain at this time.  Patient has taken medication today and patient has eaten today.  Patient declines the flu vaccine today.

## 2016-03-28 ENCOUNTER — Encounter (INDEPENDENT_AMBULATORY_CARE_PROVIDER_SITE_OTHER): Payer: Self-pay

## 2016-03-28 ENCOUNTER — Ambulatory Visit: Payer: Managed Care, Other (non HMO) | Attending: Internal Medicine

## 2016-03-28 DIAGNOSIS — I10 Essential (primary) hypertension: Secondary | ICD-10-CM

## 2016-03-28 LAB — LIPID PANEL
Cholesterol: 186 mg/dL (ref 125–200)
HDL: 48 mg/dL (ref >=46)
LDL Cholesterol: 121 mg/dL (ref <130)
Total CHOL/HDL Ratio: 3.9 Ratio (ref <=5.0)
Triglycerides: 87 mg/dL (ref <150)
VLDL: 17 mg/dL (ref <30)

## 2016-03-28 LAB — COMPLETE METABOLIC PANEL WITH GFR
ALT: 10 U/L (ref 6–29)
AST: 16 U/L (ref 10–35)
Albumin: 4.4 g/dL (ref 3.6–5.1)
Alkaline Phosphatase: 46 U/L (ref 33–130)
BUN: 12 mg/dL (ref 7–25)
CO2: 29 mmol/L (ref 20–31)
Calcium: 9.8 mg/dL (ref 8.6–10.4)
Chloride: 99 mmol/L (ref 98–110)
Creat: 0.67 mg/dL (ref 0.50–1.05)
GFR, Est African American: >89 mL/min (ref >=60)
GFR, Est Non African American: >89 mL/min (ref >=60)
Glucose, Bld: 98 mg/dL (ref 65–99)
Potassium: 3.4 mmol/L — ABNORMAL LOW (ref 3.5–5.3)
Sodium: 140 mmol/L (ref 135–146)
Total Bilirubin: 0.5 mg/dL (ref 0.2–1.2)
Total Protein: 7.2 g/dL (ref 6.1–8.1)

## 2016-03-28 NOTE — Progress Notes (Signed)
Patient here for lab work only 

## 2016-03-29 ENCOUNTER — Telehealth: Payer: Self-pay

## 2016-03-29 NOTE — Telephone Encounter (Signed)
Clld pt - advsd of lab results; Potassium level was slightly low - increase intake of Potassium rich foods such as bananas. Pt stated she understood and have no other questions at this time.

## 2016-03-29 NOTE — Telephone Encounter (Signed)
-----   Message from Jaclyn ShaggyEnobong Amao, MD sent at 03/29/2016  8:58 AM EDT ----- Potassium is slightly low; encourage intake of potassium rich foods.Lipids are normal.

## 2016-04-01 ENCOUNTER — Other Ambulatory Visit: Payer: Managed Care, Other (non HMO)

## 2016-04-05 ENCOUNTER — Ambulatory Visit: Payer: Managed Care, Other (non HMO)

## 2016-04-17 ENCOUNTER — Encounter: Payer: Managed Care, Other (non HMO) | Admitting: Family Medicine

## 2016-04-19 ENCOUNTER — Encounter: Payer: Self-pay | Admitting: Family Medicine

## 2016-04-19 ENCOUNTER — Other Ambulatory Visit (HOSPITAL_COMMUNITY): Admission: RE | Admit: 2016-04-19 | Payer: Managed Care, Other (non HMO) | Source: Ambulatory Visit

## 2016-04-19 ENCOUNTER — Ambulatory Visit: Payer: Managed Care, Other (non HMO) | Attending: Family Medicine | Admitting: Family Medicine

## 2016-04-19 ENCOUNTER — Other Ambulatory Visit: Payer: Self-pay | Admitting: Family Medicine

## 2016-04-19 VITALS — BP 122/81 | HR 81 | Temp 98.4°F | Ht 62.0 in | Wt 159.7 lb

## 2016-04-19 DIAGNOSIS — E876 Hypokalemia: Secondary | ICD-10-CM | POA: Diagnosis not present

## 2016-04-19 DIAGNOSIS — Z1231 Encounter for screening mammogram for malignant neoplasm of breast: Secondary | ICD-10-CM | POA: Diagnosis not present

## 2016-04-19 DIAGNOSIS — Z124 Encounter for screening for malignant neoplasm of cervix: Secondary | ICD-10-CM | POA: Diagnosis not present

## 2016-04-19 DIAGNOSIS — Z1239 Encounter for other screening for malignant neoplasm of breast: Secondary | ICD-10-CM

## 2016-04-19 DIAGNOSIS — Z1211 Encounter for screening for malignant neoplasm of colon: Secondary | ICD-10-CM

## 2016-04-19 DIAGNOSIS — Z0000 Encounter for general adult medical examination without abnormal findings: Secondary | ICD-10-CM

## 2016-04-19 DIAGNOSIS — N898 Other specified noninflammatory disorders of vagina: Secondary | ICD-10-CM | POA: Insufficient documentation

## 2016-04-19 DIAGNOSIS — I10 Essential (primary) hypertension: Secondary | ICD-10-CM

## 2016-04-19 DIAGNOSIS — Z7982 Long term (current) use of aspirin: Secondary | ICD-10-CM | POA: Insufficient documentation

## 2016-04-19 MED ORDER — LISINOPRIL 5 MG PO TABS: 5.0000 mg | ORAL_TABLET | Freq: Every day | ORAL | 5 refills | Status: DC

## 2016-04-19 NOTE — Patient Instructions (Signed)
Health Maintenance, Female Adopting a healthy lifestyle and getting preventive care can go a long way to promote health and wellness. Talk with your health care provider about what schedule of regular examinations is right for you. This is a good chance for you to check in with your provider about disease prevention and staying healthy. In between checkups, there are plenty of things you can do on your own. Experts have done a lot of research about which lifestyle changes and preventive measures are most likely to keep you healthy. Ask your health care provider for more information. WEIGHT AND DIET  Eat a healthy diet  Be sure to include plenty of vegetables, fruits, low-fat dairy products, and lean protein.  Do not eat a lot of foods high in solid fats, added sugars, or salt.  Get regular exercise. This is one of the most important things you can do for your health.  Most adults should exercise for at least 150 minutes each week. The exercise should increase your heart rate and make you sweat (moderate-intensity exercise).  Most adults should also do strengthening exercises at least twice a week. This is in addition to the moderate-intensity exercise.  Maintain a healthy weight  Body mass index (BMI) is a measurement that can be used to identify possible weight problems. It estimates body fat based on height and weight. Your health care provider can help determine your BMI and help you achieve or maintain a healthy weight.  For females 20 years of age and older:   A BMI below 18.5 is considered underweight.  A BMI of 18.5 to 24.9 is normal.  A BMI of 25 to 29.9 is considered overweight.  A BMI of 30 and above is considered obese.  Watch levels of cholesterol and blood lipids  You should start having your blood tested for lipids and cholesterol at 51 years of age, then have this test every 5 years.  You may need to have your cholesterol levels checked more often if:  Your lipid  or cholesterol levels are high.  You are older than 50 years of age.  You are at high risk for heart disease.  CANCER SCREENING   Lung Cancer  Lung cancer screening is recommended for adults 55-80 years old who are at high risk for lung cancer because of a history of smoking.  A yearly low-dose CT scan of the lungs is recommended for people who:  Currently smoke.  Have quit within the past 15 years.  Have at least a 30-pack-year history of smoking. A pack year is smoking an average of one pack of cigarettes a day for 1 year.  Yearly screening should continue until it has been 15 years since you quit.  Yearly screening should stop if you develop a health problem that would prevent you from having lung cancer treatment.  Breast Cancer  Practice breast self-awareness. This means understanding how your breasts normally appear and feel.  It also means doing regular breast self-exams. Let your health care provider know about any changes, no matter how small.  If you are in your 20s or 30s, you should have a clinical breast exam (CBE) by a health care provider every 1-3 years as part of a regular health exam.  If you are 40 or older, have a CBE every year. Also consider having a breast X-ray (mammogram) every year.  If you have a family history of breast cancer, talk to your health care provider about genetic screening.  If you   are at high risk for breast cancer, talk to your health care provider about having an MRI and a mammogram every year.  Breast cancer gene (BRCA) assessment is recommended for women who have family members with BRCA-related cancers. BRCA-related cancers include:  Breast.  Ovarian.  Tubal.  Peritoneal cancers.  Results of the assessment will determine the need for genetic counseling and BRCA1 and BRCA2 testing. Cervical Cancer Your health care provider may recommend that you be screened regularly for cancer of the pelvic organs (ovaries, uterus, and  vagina). This screening involves a pelvic examination, including checking for microscopic changes to the surface of your cervix (Pap test). You may be encouraged to have this screening done every 3 years, beginning at age 21.  For women ages 30-65, health care providers may recommend pelvic exams and Pap testing every 3 years, or they may recommend the Pap and pelvic exam, combined with testing for human papilloma virus (HPV), every 5 years. Some types of HPV increase your risk of cervical cancer. Testing for HPV may also be done on women of any age with unclear Pap test results.  Other health care providers may not recommend any screening for nonpregnant women who are considered low risk for pelvic cancer and who do not have symptoms. Ask your health care provider if a screening pelvic exam is right for you.  If you have had past treatment for cervical cancer or a condition that could lead to cancer, you need Pap tests and screening for cancer for at least 20 years after your treatment. If Pap tests have been discontinued, your risk factors (such as having a new sexual partner) need to be reassessed to determine if screening should resume. Some women have medical problems that increase the chance of getting cervical cancer. In these cases, your health care provider may recommend more frequent screening and Pap tests. Colorectal Cancer  This type of cancer can be detected and often prevented.  Routine colorectal cancer screening usually begins at 50 years of age and continues through 51 years of age.  Your health care provider may recommend screening at an earlier age if you have risk factors for colon cancer.  Your health care provider may also recommend using home test kits to check for hidden blood in the stool.  A small camera at the end of a tube can be used to examine your colon directly (sigmoidoscopy or colonoscopy). This is done to check for the earliest forms of colorectal  cancer.  Routine screening usually begins at age 50.  Direct examination of the colon should be repeated every 5-10 years through 51 years of age. However, you may need to be screened more often if early forms of precancerous polyps or small growths are found. Skin Cancer  Check your skin from head to toe regularly.  Tell your health care provider about any new moles or changes in moles, especially if there is a change in a mole's shape or color.  Also tell your health care provider if you have a mole that is larger than the size of a pencil eraser.  Always use sunscreen. Apply sunscreen liberally and repeatedly throughout the day.  Protect yourself by wearing long sleeves, pants, a wide-brimmed hat, and sunglasses whenever you are outside. HEART DISEASE, DIABETES, AND HIGH BLOOD PRESSURE   High blood pressure causes heart disease and increases the risk of stroke. High blood pressure is more likely to develop in:  People who have blood pressure in the high end   of the normal range (130-139/85-89 mm Hg).  People who are overweight or obese.  People who are African American.  If you are 38-23 years of age, have your blood pressure checked every 3-5 years. If you are 61 years of age or older, have your blood pressure checked every year. You should have your blood pressure measured twice--once when you are at a hospital or clinic, and once when you are not at a hospital or clinic. Record the average of the two measurements. To check your blood pressure when you are not at a hospital or clinic, you can use:  An automated blood pressure machine at a pharmacy.  A home blood pressure monitor.  If you are between 45 years and 39 years old, ask your health care provider if you should take aspirin to prevent strokes.  Have regular diabetes screenings. This involves taking a blood sample to check your fasting blood sugar level.  If you are at a normal weight and have a low risk for diabetes,  have this test once every three years after 51 years of age.  If you are overweight and have a high risk for diabetes, consider being tested at a younger age or more often. PREVENTING INFECTION  Hepatitis B  If you have a higher risk for hepatitis B, you should be screened for this virus. You are considered at high risk for hepatitis B if:  You were born in a country where hepatitis B is common. Ask your health care provider which countries are considered high risk.  Your parents were born in a high-risk country, and you have not been immunized against hepatitis B (hepatitis B vaccine).  You have HIV or AIDS.  You use needles to inject street drugs.  You live with someone who has hepatitis B.  You have had sex with someone who has hepatitis B.  You get hemodialysis treatment.  You take certain medicines for conditions, including cancer, organ transplantation, and autoimmune conditions. Hepatitis C  Blood testing is recommended for:  Everyone born from [birth year] through 1966.  Anyone with known risk factors for hepatitis C. Sexually transmitted infections (STIs)  You should be screened for sexually transmitted infections (STIs) including gonorrhea and chlamydia if:  You are sexually active and are younger than 51 years of age.  You are older than 51 years of age and your health care provider tells you that you are at risk for this type of infection.  Your sexual activity has changed since you were last screened and you are at an increased risk for chlamydia or gonorrhea. Ask your health care provider if you are at risk.  If you do not have HIV, but are at risk, it may be recommended that you take a prescription medicine daily to prevent HIV infection. This is called pre-exposure prophylaxis (PrEP). You are considered at risk if:  You are sexually active and do not regularly use condoms or know the HIV status of your partner(s).  You take drugs by injection.  You are sexually  active with a partner who has HIV. Talk with your health care provider about whether you are at high risk of being infected with HIV. If you choose to begin PrEP, you should first be tested for HIV. You should then be tested every 3 months for as long as you are taking PrEP.  PREGNANCY   If you are premenopausal and you may become pregnant, ask your health care provider about preconception counseling.  If you may  become pregnant, take 400 to 800 micrograms (mcg) of folic acid every day.  If you want to prevent pregnancy, talk to your health care provider about birth control (contraception). OSTEOPOROSIS AND MENOPAUSE   Osteoporosis is a disease in which the bones lose minerals and strength with aging. This can result in serious bone fractures. Your risk for osteoporosis can be identified using a bone density scan.  If you are 61 years of age or older, or if you are at risk for osteoporosis and fractures, ask your health care provider if you should be screened.  Ask your health care provider whether you should take a calcium or vitamin D supplement to lower your risk for osteoporosis.  Menopause may have certain physical symptoms and risks.  Hormone replacement therapy may reduce some of these symptoms and risks. Talk to your health care provider about whether hormone replacement therapy is right for you.  HOME CARE INSTRUCTIONS   Schedule regular health, dental, and eye exams.  Stay current with your immunizations.   Do not use any tobacco products including cigarettes, chewing tobacco, or electronic cigarettes.  If you are pregnant, do not drink alcohol.  If you are breastfeeding, limit how much and how often you drink alcohol.  Limit alcohol intake to no more than 1 drink per day for nonpregnant women. One drink equals 12 ounces of beer, 5 ounces of wine, or 1 ounces of hard liquor.  Do not use street drugs.  Do not share needles.  Ask your health care provider for help if  you need support or information about quitting drugs.  Tell your health care provider if you often feel depressed.  Tell your health care provider if you have ever been abused or do not feel safe at home.   This information is not intended to replace advice given to you by your health care provider. Make sure you discuss any questions you have with your health care provider.   Document Released: 01/07/2011 Document Revised: 07/15/2014 Document Reviewed: 05/26/2013 Elsevier Interactive Patient Education [employer].

## 2016-04-19 NOTE — Progress Notes (Signed)
Vaginal discharge

## 2016-04-19 NOTE — Progress Notes (Signed)
Subjective:  Patient ID: Grace [name], female    DOB: 18-Aug-1965  Age: 50 y.o. MRN: [id number]  CC: Annual Exam; Hypertension; and Vaginal Discharge   HPI Grace [name] presents forA complete physical exam and complains of occasional vaginal discharge with an odor but no vaginal itching.  Past Medical History:  Diagnosis Date  . Hypertension     History reviewed. No pertinent surgical history.  No Known Allergies   Outpatient Medications Prior to Visit  Medication Sig Dispense Refill  . aspirin EC 81 MG tablet Take 1 every other day 90 tablet 1  . aspirin-acetaminophen-caffeine (EXCEDRIN MIGRAINE) 250-250-65 MG tablet Take 1 tablet by mouth every 6 (six) hours as needed for headache.    . hydrochlorothiazide (HYDRODIURIL) 25 MG tablet Take 1 tablet (25 mg total) by mouth daily. 30 tablet 3   No facility-administered medications prior to visit.     ROS Review of Systems  Constitutional: Negative for activity change, appetite change and fatigue.  HENT: Negative for congestion, sinus pressure and sore throat.   Eyes: Negative for visual disturbance.  Respiratory: Negative for cough, chest tightness, shortness of breath and wheezing.   Cardiovascular: Negative for chest pain and palpitations.  Gastrointestinal: Negative for abdominal distention, abdominal pain and constipation.  Endocrine: Negative for polydipsia.  Genitourinary: Positive for vaginal discharge. Negative for dysuria and frequency.  Musculoskeletal: Negative for arthralgias and back pain.  Skin: Negative for rash.  Neurological: Negative for tremors, light-headedness and numbness.  Hematological: Does not bruise/bleed easily.  Psychiatric/Behavioral: Negative for agitation and behavioral problems.    Objective:  BP 122/81 (BP Location: Right Arm, Patient Position: Sitting, Cuff Size: Large)   Pulse 81   Temp 98.4 F (36.9 C) (Oral)   Ht 5\' 2"  (1.575 m)   Wt 159 lb 11.2 oz (72.4 kg)   SpO2 98%   BMI  29.21 kg/m   BP/Weight 04/19/2016 03/25/2016 03/01/2016  Systolic BP 122 128 150  Diastolic BP 81 78 104  Wt. (Lbs) 159.7 155.8 153.8  BMI 29.21 28.5 -      Physical Exam  Constitutional: She is oriented to person, place, and time. She appears well-developed and well-nourished. No distress.  HENT:  Head: Normocephalic.  Right Ear: External ear normal.  Left Ear: External ear normal.  Nose: Nose normal.  Mouth/Throat: Oropharynx is clear and moist.  Eyes: Conjunctivae and EOM are normal. Pupils are equal, round, and reactive to light.  Neck: Normal range of motion. No JVD present.  Cardiovascular: Normal rate, regular rhythm, normal heart sounds and intact distal pulses.  Exam reveals no gallop.   No murmur heard. Pulmonary/Chest: Effort normal and breath sounds normal. No respiratory distress. She has no wheezes. She has no rales. She exhibits no tenderness. Right breast exhibits no mass and no tenderness. Left breast exhibits no mass and no tenderness.  Abdominal: Soft. Bowel sounds are normal. She exhibits no distension and no mass. There is no tenderness.  Genitourinary:  Genitourinary Comments: Normal external genitalia, slight yellowish discharge in vagina, normal cervix, no CMT  Musculoskeletal: Normal range of motion. She exhibits no edema or tenderness.  Neurological: She is alert and oriented to person, place, and time. She has normal reflexes.  Skin: Skin is warm and dry. She is not diaphoretic.  Psychiatric: She has a normal mood and affect.     Assessment & Plan:   1. Screening for breast cancer - MM Digital Screening; Future  2. Screening for cervical cancer -  Cytology - PAP [place]  3. Hypokalemia Switched from hydrochlorothiazide to lisinopril - Basic Metabolic Panel; Future  4. Physical exam Vaginal cultures sent off for vaginal discharge and will treat accordingly  5. Essential hypertension - lisinopril (PRINIVIL,ZESTRIL) 5 MG tablet; Take 1  tablet (5 mg total) by mouth daily.  Dispense: 30 tablet; Refill: 5  6. Special screening for malignant neoplasms, colon - Ambulatory referral to Gastroenterology   Meds ordered this encounter  Medications  . lisinopril (PRINIVIL,ZESTRIL) 5 MG tablet    Sig: Take 1 tablet (5 mg total) by mouth daily.    Dispense:  30 tablet    Refill:  5    Discontinue hydrochlorothiazide    Follow-up: Return in about 6 months (around 10/18/2016) for Follow-up on hypertension.   Jaclyn Shaggy MD

## 2016-04-24 LAB — CYTOLOGY - PAP
Diagnosis: NEGATIVE
HPV: NOT DETECTED

## 2016-04-24 LAB — CERVICOVAGINAL ANCILLARY ONLY: Candida vaginitis: NEGATIVE

## 2016-04-25 ENCOUNTER — Telehealth: Payer: Self-pay

## 2016-04-25 ENCOUNTER — Other Ambulatory Visit: Payer: Self-pay | Admitting: Family Medicine

## 2016-04-25 MED ORDER — METRONIDAZOLE 0.75 % VA GEL: 1.0000 | Freq: Every day | VAGINAL | 0 refills | Status: DC

## 2016-04-25 NOTE — Telephone Encounter (Signed)
-----   Message from Jaclyn ShaggyEnobong Amao, MD sent at 04/25/2016  1:23 PM EDT ----- Vaginal culture was positive for bacterial vaginosis which is not an STD; Pap smear negative for malignancy.

## 2016-04-25 NOTE — Telephone Encounter (Signed)
Writer called patient per Dr. Venetia NightAmao and discussed her pap smear showing a BV , patient stated understanding and will pick up the prescribed medication at the pharmacy.

## 2016-05-09 ENCOUNTER — Ambulatory Visit: Payer: Self-pay | Attending: Family Medicine

## 2016-05-09 DIAGNOSIS — E876 Hypokalemia: Secondary | ICD-10-CM | POA: Insufficient documentation

## 2016-05-09 NOTE — Progress Notes (Signed)
Patient here for lab visit only 

## 2016-05-10 ENCOUNTER — Telehealth: Payer: Self-pay

## 2016-05-10 LAB — BASIC METABOLIC PANEL
BUN: 11 mg/dL (ref 7–25)
CO2: 26 mmol/L (ref 20–31)
Calcium: 9.5 mg/dL (ref 8.6–10.4)
Chloride: 106 mmol/L (ref 98–110)
Creat: 0.89 mg/dL (ref 0.50–1.05)
Glucose, Bld: 76 mg/dL (ref 65–99)
Potassium: 3.9 mmol/L (ref 3.5–5.3)
Sodium: 143 mmol/L (ref 135–146)

## 2016-05-10 NOTE — Telephone Encounter (Signed)
-----   Message from Jaclyn ShaggyEnobong Amao, MD sent at 05/10/2016  2:03 PM EDT ----- Please inform the patient that labs are normal. Thank you.

## 2016-05-10 NOTE — Telephone Encounter (Signed)
Writer called patient to discuss lab results. LVM requesting patient to return this call. 

## 2016-05-13 ENCOUNTER — Telehealth: Payer: Self-pay

## 2016-05-13 NOTE — Telephone Encounter (Signed)
Writer called patient per Dr. Venetia NightAmao regarding test results.  Patient stated understanding of results.

## 2016-05-13 NOTE — Telephone Encounter (Signed)
-----   Message from Enobong Amao, MD sent at 05/10/2016  2:03 PM EDT ----- Please inform the patient that labs are normal. Thank you. 

## 2016-05-23 ENCOUNTER — Encounter: Payer: Self-pay | Admitting: Family Medicine

## 2016-08-21 ENCOUNTER — Other Ambulatory Visit: Payer: Self-pay | Admitting: Pharmacist

## 2016-08-21 DIAGNOSIS — I10 Essential (primary) hypertension: Secondary | ICD-10-CM

## 2016-08-21 MED ORDER — LISINOPRIL 5 MG PO TABS: 5.0000 mg | ORAL_TABLET | Freq: Every day | ORAL | 0 refills | Status: DC

## 2016-09-12 ENCOUNTER — Emergency Department (HOSPITAL_COMMUNITY)
Admission: EM | Admit: 2016-09-12 | Discharge: 2016-09-12 | Disposition: A | Discharge status: Home / Self Care | Payer: Self-pay | Attending: Emergency Medicine | Admitting: Emergency Medicine

## 2016-09-12 ENCOUNTER — Encounter (HOSPITAL_COMMUNITY): Payer: Self-pay

## 2016-09-12 DIAGNOSIS — Z79899 Other long term (current) drug therapy: Secondary | ICD-10-CM | POA: Insufficient documentation

## 2016-09-12 DIAGNOSIS — I10 Essential (primary) hypertension: Secondary | ICD-10-CM | POA: Insufficient documentation

## 2016-09-12 DIAGNOSIS — Z7982 Long term (current) use of aspirin: Secondary | ICD-10-CM | POA: Insufficient documentation

## 2016-09-12 LAB — URINALYSIS, ROUTINE W REFLEX MICROSCOPIC
Bilirubin Urine: NEGATIVE
Glucose, UA: NEGATIVE mg/dL
Hgb urine dipstick: NEGATIVE
Ketones, ur: 5 mg/dL — AB
Leukocytes, UA: NEGATIVE
Nitrite: NEGATIVE
Protein, ur: NEGATIVE mg/dL
Specific Gravity, Urine: 1.008 (ref 1.005–1.030)
pH: 6 (ref 5.0–8.0)

## 2016-09-12 LAB — BASIC METABOLIC PANEL
Anion gap: 11 (ref 5–15)
BUN: 13 mg/dL (ref 6–20)
CO2: 25 mmol/L (ref 22–32)
Calcium: 10.3 mg/dL (ref 8.9–10.3)
Chloride: 104 mmol/L (ref 101–111)
Creatinine, Ser: 0.68 mg/dL (ref 0.44–1.00)
GFR calc Af Amer: >60 mL/min (ref >60)
GFR calc non Af Amer: >60 mL/min (ref >60)
Glucose, Bld: 109 mg/dL — ABNORMAL HIGH (ref 65–99)
Potassium: 3.5 mmol/L (ref 3.5–5.1)
Sodium: 140 mmol/L (ref 135–145)

## 2016-09-12 LAB — CBC WITH DIFFERENTIAL/PLATELET
Basophils Absolute: 0 10*3/uL (ref 0.0–0.1)
Basophils Relative: 0 %
Eosinophils Absolute: 0 10*3/uL (ref 0.0–0.7)
Eosinophils Relative: 1 %
HCT: 40.1 % (ref 36.0–46.0)
Hemoglobin: 13.2 g/dL (ref 12.0–15.0)
Lymphocytes Relative: 35 %
Lymphs Abs: 2.4 10*3/uL (ref 0.7–4.0)
MCH: 28.5 pg (ref 26.0–34.0)
MCHC: 32.9 g/dL (ref 30.0–36.0)
MCV: 86.6 fL (ref 78.0–100.0)
Monocytes Absolute: 0.4 10*3/uL (ref 0.1–1.0)
Monocytes Relative: 5 %
Neutro Abs: 4.1 10*3/uL (ref 1.7–7.7)
Neutrophils Relative %: 59 %
Platelets: 347 10*3/uL (ref 150–400)
RBC: 4.63 MIL/uL (ref 3.87–5.11)
RDW: 14.2 % (ref 11.5–15.5)
WBC: 6.9 10*3/uL (ref 4.0–10.5)

## 2016-09-12 LAB — I-STAT TROPONIN, ED: Troponin i, poc: 0.01 ng/mL (ref 0.00–0.08)

## 2016-09-12 MED ORDER — LISINOPRIL 10 MG PO TABS: 10.0000 mg | ORAL_TABLET | Freq: Every day | ORAL | 1 refills | Status: DC

## 2016-09-12 NOTE — ED Notes (Signed)
Pt has been having high blood pressure readings for the past week. Also c/o left sided headache.

## 2016-09-12 NOTE — ED Provider Notes (Signed)
[facility]-EMERGENCY DEPT Provider Note   CSN: [id number] Arrival date & time: 09/12/16  1903     History   Chief Complaint Chief Complaint  Patient presents with  . Hypertension    HPI Grace [name] is a 51 y.o. female.  HPI Grace [name] is a 51 y.o. female with history of hypertension, presents to emergency department complaining of a headache and elevated blood pressure. Patient states that she has had headaches for approximately week. Patient states she has been checking her blood pressure since her headache started and her blood pressure has been 180 systolic. She does report constant dull headache that is worse on the left side but states it's around the head. She denies any associated blurred vision. No nausea or vomiting. No photophobia. Denies any numbness or weakness in extremities. No difficulty with speech or ambulating. Patient takes lisinopril for her headache and has been on it for approximately half a year. She takes a very low-dose of 5 mg daily. She denies any other medications or supplements. She states she has been sleeping poorly because of the headache. She also reports that she has not been eating as well as in the past and stopped exercising recently. Denies drugs or alcohol. Denies any other complaints. Patient does have a primary care doctor but was unable to get the appointment until next month. She is here stating "I just want to make sure I don't have a stroke."  Past Medical History:  Diagnosis Date  . Hypertension     Patient Active Problem List   Diagnosis Date Noted  . Hypertension 03/25/2016    History reviewed. No pertinent surgical history.  OB History    No data available       Home Medications    Prior to Admission medications   Medication Sig Start Date End Date Taking? Authorizing Provider  aspirin EC 81 MG tablet Take 1 every other day Patient taking differently: Take 81 mg by mouth daily.  03/01/16  Yes Anders SimmondsAngela M McClung, PA-C    aspirin-acetaminophen-caffeine (EXCEDRIN MIGRAINE) [id number] MG tablet Take 1 tablet by mouth every 6 (six) hours as needed for headache.   Yes Historical Provider, MD  lisinopril (PRINIVIL,ZESTRIL) 5 MG tablet Take 1 tablet (5 mg total) by mouth daily. 08/21/16  Yes Jaclyn ShaggyEnobong Amao, MD  metroNIDAZOLE (METROGEL VAGINAL) 0.75 % vaginal gel Place 1 Applicatorful vaginally at bedtime. Patient not taking: Reported on 09/12/2016 04/25/16   Jaclyn ShaggyEnobong Amao, MD    Family History No family history on file.  Social History Social History  Substance Use Topics  . Smoking status: Never Smoker  . Smokeless tobacco: Never Used  . Alcohol use Yes     Comment: occasionally     Allergies   Patient has no known allergies.   Review of Systems Review of Systems  Constitutional: Negative for chills and fever.  HENT: Negative for congestion, postnasal drip and sinus pressure.   Respiratory: Negative for cough, chest tightness and shortness of breath.   Cardiovascular: Negative for chest pain, palpitations and leg swelling.  Gastrointestinal: Negative for abdominal pain, diarrhea, nausea and vomiting.  Genitourinary: Negative for dysuria, flank pain and pelvic pain.  Musculoskeletal: Negative for arthralgias, myalgias, neck pain and neck stiffness.  Skin: Negative for rash.  Neurological: Positive for headaches. Negative for dizziness, weakness, light-headedness and numbness.  All other systems reviewed and are negative.    Physical Exam Updated Vital Signs BP (!) 160/108 (BP Location: Left Arm)   Pulse 80  Temp 98 F (36.7 C) (Oral)   Resp 18   Wt 71.1 kg   BMI 28.68 kg/m   Physical Exam  Constitutional: She is oriented to person, place, and time. She appears well-developed and well-nourished. No distress.  HENT:  Head: Normocephalic.  Eyes: Conjunctivae are normal.  Neck: Neck supple.  Cardiovascular: Normal rate, regular rhythm and normal heart sounds.   Pulmonary/Chest: Effort normal  and breath sounds normal. No respiratory distress. She has no wheezes. She has no rales.  Abdominal: Soft. Bowel sounds are normal. She exhibits no distension. There is no tenderness. There is no rebound.  Musculoskeletal: She exhibits no edema.  Neurological: She is alert and oriented to person, place, and time.  Skin: Skin is warm and dry.  Psychiatric: She has a normal mood and affect. Her behavior is normal.  Nursing note and vitals reviewed.    ED Treatments / Results  Labs (all labs ordered are listed, but only abnormal results are displayed) Labs Reviewed  BASIC METABOLIC PANEL - Abnormal; Notable for the following:       Result Value   Glucose, Bld 109 (*)    All other components within normal limits  URINALYSIS, ROUTINE W REFLEX MICROSCOPIC - Abnormal; Notable for the following:    Color, Urine STRAW (*)    Ketones, ur 5 (*)    All other components within normal limits  CBC WITH DIFFERENTIAL/PLATELET  I-STAT TROPOININ, ED    EKG  EKG Interpretation  Date/Time:  Thursday September 12 2016 21:57:31 EST Ventricular Rate:  60 PR Interval:    QRS Duration: 158 QT Interval:  455 QTC Calculation: 455 R Axis:   -50 Text Interpretation:  Sinus rhythm Right bundle branch block No significant change since last tracing Confirmed by [facility] MD, Denny Peon ([id number]) on 09/12/2016 10:55:25 PM       Radiology No results found.  Procedures Procedures (including critical care time)  Medications Ordered in ED Medications - No data to display   Initial Impression / Assessment and Plan / ED Course  I have reviewed the triage vital signs and the nursing notes.  Pertinent labs & imaging results that were available during my care of the patient were reviewed by me and considered in my medical decision making (see chart for details).     She delivered department with dull constant headache for approximately a week and elevated blood pressures at home. Patient is on lisinopril, 5 mg.  There has not been any changes in her medications or any new stressors, however she does admit to unhealthy diet and no exercises recently. She denies any chest pain, shortness of breath, swelling. No neurological deficits. Will check basic labs, EKG, urinalysis. Blood pressure here is 160/108. Will recheck.   11:02 PM Patient's blood work all within normal. Her EKG showing right bundle branch block which is unchanged. Her blood pressure rechecked is 163/94. She is in no acute distress. Not concerning for intracranial bleed. Gradual onset of headache that has been constant for a week. Recommended taking Tylenol for her headache, and will increase her does of lisinopril to 10 mg. Discussed with Dr. Dalene Seltzer who agrees to the plan. Follow-up with family doctor. Discussed possibility of sleep apnea given fatigue, headache, elevated blood pressure, snoring at nighttime. Also discussed reducing sodium intake.   Vitals:   09/12/16 1929 09/12/16 2158 09/12/16 2200  BP: (!) 160/108 163/94 163/94  Pulse: 80 61 (!) 58  Resp: 18 14 11   Temp: 98 F (36.7 C)  97.5 F (36.4 C)  TempSrc: Oral  Oral  SpO2:  96% 99%  Weight: 71.1 kg       Final Clinical Impressions(s) / ED Diagnoses   Final diagnoses:  Hypertension, unspecified type    New Prescriptions New Prescriptions   No medications on file     Jaynie Crumble, PA-C 09/12/16 2305    Alvira Monday, MD 09/20/16 [phone]

## 2016-09-12 NOTE — Discharge Instructions (Signed)
Start taking lisinopril 10 mg daily. Take Tylenol for headache. Please follow-up with family doctor for recheck. Return if worsening symptoms

## 2016-10-14 ENCOUNTER — Telehealth: Payer: Self-pay

## 2016-10-14 NOTE — Telephone Encounter (Signed)
Called to find out if pt would like to have colonoscopy scheduled 

## 2016-10-16 ENCOUNTER — Telehealth: Payer: Self-pay | Admitting: Family Medicine

## 2016-10-16 NOTE — Telephone Encounter (Signed)
Patient returned nurse calling regarding scheduling a colonoscopy, Please follow up.

## 2016-10-29 ENCOUNTER — Telehealth: Payer: Self-pay | Admitting: Family Medicine

## 2016-10-29 NOTE — Telephone Encounter (Signed)
Pt called requesting a refill on script that she received from the ED. States that they upped the dosage of her med and only has one pill left/ Please f/u. Thank you.

## 2016-10-30 MED ORDER — LISINOPRIL 10 MG PO TABS: 10.0000 mg | ORAL_TABLET | Freq: Every day | ORAL | 3 refills | Status: DC

## 2016-10-30 NOTE — Telephone Encounter (Signed)
Refilled

## 2016-11-08 ENCOUNTER — Ambulatory Visit: Payer: Self-pay | Attending: Family Medicine | Admitting: Family Medicine

## 2016-11-08 ENCOUNTER — Encounter: Payer: Self-pay | Admitting: Family Medicine

## 2016-11-08 VITALS — BP 134/77 | HR 86 | Temp 97.6°F | Resp 16 | Ht 62.0 in | Wt 156.0 lb

## 2016-11-08 DIAGNOSIS — Z7982 Long term (current) use of aspirin: Secondary | ICD-10-CM | POA: Insufficient documentation

## 2016-11-08 DIAGNOSIS — I10 Essential (primary) hypertension: Secondary | ICD-10-CM | POA: Insufficient documentation

## 2016-11-08 MED ORDER — LISINOPRIL 10 MG PO TABS: 10.0000 mg | ORAL_TABLET | Freq: Every day | ORAL | 6 refills | Status: DC

## 2016-11-08 NOTE — Progress Notes (Signed)
Subjective:  Patient ID: Grace [name], female    DOB: April 27, 1966  Age: 51 y.o. MRN: [id number]  CC: Hypertension (follow up)   HPI Grace [name] is a 51 year old female with a history of hypertension who presents today for a follow-up on hypertension. She had an ED visit where she presented with elevated blood pressure and her lisinopril was increased from 5 mg to 10 mg.  She has been compliant with lisinopril and denies any side effects from it. She does not exercise regularly but adheres to a low sodium diet. Denies chest pains or shortness of breath.  Past Medical History:  Diagnosis Date  . Hypertension     No past surgical history on file.  No Known Allergies     Outpatient Medications Prior to Visit  Medication Sig Dispense Refill  . aspirin EC 81 MG tablet Take 1 every other day (Patient taking differently: Take 81 mg by mouth daily. ) 90 tablet 1  . aspirin-acetaminophen-caffeine (EXCEDRIN MIGRAINE) 250-250-65 MG tablet Take 1 tablet by mouth every 6 (six) hours as needed for headache.    . lisinopril (PRINIVIL,ZESTRIL) 10 MG tablet Take 1 tablet (10 mg total) by mouth daily. 30 tablet 3  . metroNIDAZOLE (METROGEL VAGINAL) 0.75 % vaginal gel Place 1 Applicatorful vaginally at bedtime. (Patient not taking: Reported on 09/12/2016) 70 g 0   No facility-administered medications prior to visit.     ROS Review of Systems  Constitutional: Negative for activity change, appetite change and fatigue.  HENT: Negative for congestion, sinus pressure and sore throat.   Eyes: Negative for visual disturbance.  Respiratory: Negative for cough, chest tightness, shortness of breath and wheezing.   Cardiovascular: Negative for chest pain and palpitations.  Gastrointestinal: Negative for abdominal distention, abdominal pain and constipation.  Endocrine: Negative for polydipsia.  Genitourinary: Negative for dysuria and frequency.  Musculoskeletal: Negative for arthralgias and back  pain.  Skin: Negative for rash.  Neurological: Negative for tremors, light-headedness and numbness.  Hematological: Does not bruise/bleed easily.  Psychiatric/Behavioral: Negative for agitation and behavioral problems.    Objective:  BP 134/77 (BP Location: Right Arm, Patient Position: Sitting, Cuff Size: Large)   Pulse 86   Temp 97.6 F (36.4 C) (Oral)   Resp 16   Ht _0  (1.575 m)   Wt 156 lb (70.8 kg)   SpO2 99%   BMI 28.53 kg/m   BP/Weight 11/08/2016 09/12/2016 41/28/7867  Systolic BP 672 094 709  Diastolic BP 77 628 81  Wt. (Lbs) 156 156.8 159.7  BMI 28.53 28.68 29.21      Physical Exam  Constitutional: She is oriented to person, place, and time. She appears well-developed and well-nourished.  Cardiovascular: Normal rate, normal heart sounds and intact distal pulses.   No murmur heard. Pulmonary/Chest: Effort normal and breath sounds normal. She has no wheezes. She has no rales. She exhibits no tenderness.  Abdominal: Soft. Bowel sounds are normal. She exhibits no distension and no mass. There is no tenderness.  Musculoskeletal: Normal range of motion.  Neurological: She is alert and oriented to person, place, and time.  Skin: Skin is warm and dry.  Psychiatric: She has a normal mood and affect.     Assessment & Plan:   1. Essential hypertension Controlled Will check potassium due to recent increase in dose Low sodium  Diet, lifestyle modifications - lisinopril (PRINIVIL,ZESTRIL) 10 MG tablet; Take 1 tablet (10 mg total) by mouth daily.  Dispense: 30 tablet; Refill: 6 - CMP14+EGFR  Meds ordered this encounter  Medications  . lisinopril (PRINIVIL,ZESTRIL) 10 MG tablet    Sig: Take 1 tablet (10 mg total) by mouth daily.    Dispense:  30 tablet    Refill:  6    Follow-up: Return in about 6 months (around 05/11/2017) for Follow-up on hypertension.   Arnoldo Morale MD

## 2016-11-08 NOTE — Patient Instructions (Signed)

## 2016-11-12 ENCOUNTER — Other Ambulatory Visit: Payer: Self-pay

## 2016-11-19 ENCOUNTER — Ambulatory Visit: Payer: Self-pay | Attending: Family Medicine

## 2016-11-20 LAB — CMP14+EGFR
ALT: 8 IU/L (ref 0–32)
AST: 11 IU/L (ref 0–40)
Albumin/Globulin Ratio: 1.6 (ref 1.2–2.2)
Albumin: 4.4 g/dL (ref 3.5–5.5)
Alkaline Phosphatase: 46 IU/L (ref 39–117)
BUN/Creatinine Ratio: 18 (ref 9–23)
BUN: 14 mg/dL (ref 6–24)
Bilirubin Total: <0.2 mg/dL (ref 0.0–1.2)
CO2: 28 mmol/L (ref 18–29)
Calcium: 10.4 mg/dL — ABNORMAL HIGH (ref 8.7–10.2)
Chloride: 101 mmol/L (ref 96–106)
Creatinine, Ser: 0.8 mg/dL (ref 0.57–1.00)
GFR calc Af Amer: 99 mL/min/{1.73_m2} (ref >59)
GFR calc non Af Amer: 86 mL/min/{1.73_m2} (ref >59)
Globulin, Total: 2.7 g/dL (ref 1.5–4.5)
Glucose: 90 mg/dL (ref 65–99)
Potassium: 4.2 mmol/L (ref 3.5–5.2)
Sodium: 144 mmol/L (ref 134–144)
Total Protein: 7.1 g/dL (ref 6.0–8.5)

## 2017-02-23 ENCOUNTER — Encounter (HOSPITAL_COMMUNITY): Payer: Self-pay

## 2017-02-23 ENCOUNTER — Emergency Department (HOSPITAL_COMMUNITY)
Admission: EM | Admit: 2017-02-23 | Discharge: 2017-02-23 | Disposition: A | Discharge status: Home / Self Care | Payer: Self-pay | Attending: Emergency Medicine | Admitting: Emergency Medicine

## 2017-02-23 DIAGNOSIS — Z7982 Long term (current) use of aspirin: Secondary | ICD-10-CM | POA: Insufficient documentation

## 2017-02-23 DIAGNOSIS — Z79899 Other long term (current) drug therapy: Secondary | ICD-10-CM | POA: Insufficient documentation

## 2017-02-23 DIAGNOSIS — G4489 Other headache syndrome: Secondary | ICD-10-CM

## 2017-02-23 DIAGNOSIS — I10 Essential (primary) hypertension: Secondary | ICD-10-CM

## 2017-02-23 LAB — CBC WITH DIFFERENTIAL/PLATELET
Basophils Absolute: 0 10*3/uL (ref 0.0–0.1)
Basophils Relative: 0 %
Eosinophils Absolute: 0.1 10*3/uL (ref 0.0–0.7)
Eosinophils Relative: 1 %
HCT: 40.9 % (ref 36.0–46.0)
Hemoglobin: 13 g/dL (ref 12.0–15.0)
Lymphocytes Relative: 41 %
Lymphs Abs: 1.9 10*3/uL (ref 0.7–4.0)
MCH: 28.6 pg (ref 26.0–34.0)
MCHC: 31.8 g/dL (ref 30.0–36.0)
MCV: 90.1 fL (ref 78.0–100.0)
Monocytes Absolute: 0.2 10*3/uL (ref 0.1–1.0)
Monocytes Relative: 5 %
Neutro Abs: 2.4 10*3/uL (ref 1.7–7.7)
Neutrophils Relative %: 53 %
Platelets: 294 10*3/uL (ref 150–400)
RBC: 4.54 MIL/uL (ref 3.87–5.11)
RDW: 14.1 % (ref 11.5–15.5)
WBC: 4.6 10*3/uL (ref 4.0–10.5)

## 2017-02-23 LAB — BASIC METABOLIC PANEL
Anion gap: 11 (ref 5–15)
BUN: 7 mg/dL (ref 6–20)
CO2: 24 mmol/L (ref 22–32)
Calcium: 9.9 mg/dL (ref 8.9–10.3)
Chloride: 102 mmol/L (ref 101–111)
Creatinine, Ser: 0.74 mg/dL (ref 0.44–1.00)
GFR calc Af Amer: >60 mL/min (ref >60)
GFR calc non Af Amer: >60 mL/min (ref >60)
Glucose, Bld: 109 mg/dL — ABNORMAL HIGH (ref 65–99)
Potassium: 4.4 mmol/L (ref 3.5–5.1)
Sodium: 137 mmol/L (ref 135–145)

## 2017-02-23 MED ORDER — IBUPROFEN 800 MG PO TABS: 800.0000 mg | ORAL_TABLET | Freq: Three times a day (TID) | ORAL | 0 refills | Status: DC | PRN

## 2017-02-23 MED ORDER — KETOROLAC TROMETHAMINE 30 MG/ML IJ SOLN
60.0000 mg | Freq: Once | INTRAMUSCULAR | Status: AC
Start: 2017-02-23 — End: 2017-02-23
  Administered 2017-02-23: 60 mg via INTRAMUSCULAR
  Filled 2017-02-23: qty 2

## 2017-02-23 NOTE — Discharge Instructions (Signed)
Follow up closely with your primary care doctor about further blood pressure management.  Return here as needed.  Your testing here today did not show any significant abnormalities

## 2017-02-23 NOTE — ED Provider Notes (Signed)
[facility]-EMERGENCY DEPT Provider Note   CSN: [id number] Arrival date & time: 02/23/17  0302     History   Chief Complaint Chief Complaint  Patient presents with  . Headache    HPI Grace [name] is a 51 y.o. female.  HPI Patient presents to the emergency department with elevated blood pressure with headache.  The patient states that she is currently taking her blood pressure medications, but states that she has been having headache for the last 4 days.  She states that she did take some Excedrin tension headache with some relief of her symptoms.  Patient states that nothing seems make the condition worse.  She states that she has had issues with her blood pressure causing headache in the past, states that she has to have her blood pressure medications adjusted from time to time.  The patient states she has not seen her primary care doctor in the last few months. The patient denies chest pain, shortness of breath, blurred vision, neck pain, fever, cough, weakness, numbness, dizziness, anorexia, edema, abdominal pain, nausea, vomiting, diarrhea, rash, back pain, dysuria, hematemesis, bloody stool, near syncope, or syncope.  The patient's headache is mainly on the left side of her head.  There is no changes in her vision, no numbness, weakness, dizziness or syncope.   Past Medical History:  Diagnosis Date  . Hypertension     Patient Active Problem List   Diagnosis Date Noted  . Hypertension 03/25/2016    History reviewed. No pertinent surgical history.  OB History    No data available       Home Medications    Prior to Admission medications   Medication Sig Start Date End Date Taking? Authorizing Provider  aspirin EC 81 MG tablet Take 1 every other day Patient taking differently: Take 81 mg by mouth daily.  03/01/16  Yes Anders Simmonds, PA-C  aspirin-acetaminophen-caffeine (EXCEDRIN MIGRAINE) [phone] MG tablet Take 1 tablet by mouth every 6 (six) hours as needed for headache.    Yes [provider]  lisinopril (PRINIVIL,ZESTRIL) 10 MG tablet Take 1 tablet (10 mg total) by mouth daily. 11/08/16  Yes Jaclyn Shaggy, MD    Family History No family history on file.  Social History Social History  Substance Use Topics  . Smoking status: Never Smoker  . Smokeless tobacco: Never Used  . Alcohol use Yes     Comment: occasionally     Allergies   Patient has no known allergies.   Review of Systems Review of Systems All other systems negative except as documented in the HPI. All pertinent positives and negatives as reviewed in the HPI.  Physical Exam Updated Vital Signs BP (!) 160/88   Pulse 60   Temp 97.9 F (36.6 C) (Oral)   Resp 16   Ht 5\' 2"  (1.575 m)   Wt 70.3 kg (155 lb)   SpO2 100%   BMI 28.35 kg/m   Physical Exam  Constitutional: She is oriented to person, place, and time. She appears well-developed and well-nourished. No distress.  HENT:  Head: Normocephalic and atraumatic.  Mouth/Throat: Oropharynx is clear and moist.  Eyes: Pupils are equal, round, and reactive to light.  Neck: Normal range of motion. Neck supple.  Cardiovascular: Normal rate, regular rhythm and normal heart sounds.  Exam reveals no gallop and no friction rub.   No murmur heard. Pulmonary/Chest: Effort normal and breath sounds normal. No respiratory distress. She has no wheezes.  Neurological: She is alert and oriented to  person, place, and time. No sensory deficit. She exhibits normal muscle tone. Coordination normal.  Skin: Skin is warm and dry. Capillary refill takes less than 2 seconds. No rash noted. No erythema.  Psychiatric: She has a normal mood and affect. Her behavior is normal.  Nursing note and vitals reviewed.    ED Treatments / Results  Labs (all labs ordered are listed, but only abnormal results are displayed) Labs Reviewed  BASIC METABOLIC PANEL - Abnormal; Notable for the following:       Result Value   Glucose, Bld 109 (*)    All other  components within normal limits  CBC WITH DIFFERENTIAL/PLATELET    EKG  EKG Interpretation None       Radiology No results found.  Procedures Procedures (including critical care time)  Medications Ordered in ED Medications  ketorolac (TORADOL) 30 MG/ML injection 60 mg (60 mg Intramuscular Given 02/23/17 0713)     Initial Impression / Assessment and Plan / ED Course  I have reviewed the triage vital signs and the nursing notes.  Pertinent labs & imaging results that were available during my care of the patient were reviewed by me and considered in my medical decision making (see chart for details).    Patient does not have any signs of hypertensive urgency or crisis.  We will have the patient follow up with her primary doctor for further blood pressure management.  I discussed at length about the importance of this and she agrees to that plan.  Patient is told to return here for any worsening in her condition.  Patient is feeling better at this time  Final Clinical Impressions(s) / ED Diagnoses   Final diagnoses:  None    New Prescriptions New Prescriptions   No medications on file     Charlestine Night, Cordelia Poche 02/23/17 3086    Alvira Monday, MD 02/24/17 1220

## 2017-02-23 NOTE — ED Triage Notes (Signed)
Pt presents to ED from home c/o headache on L side x4 days. Pt states has hx of HTN and wonders if her meds are not working. Pt denies photosensitivity, N/V. Pt states pain is constant. Thinks may be r/t high BP. No neck stiffness/fevers/chills, no vision changes.Marland Kitchen

## 2017-04-01 ENCOUNTER — Encounter: Payer: Self-pay | Admitting: Family Medicine

## 2017-04-01 ENCOUNTER — Ambulatory Visit: Payer: Self-pay | Attending: Family Medicine | Admitting: Family Medicine

## 2017-04-01 VITALS — BP 115/73 | HR 70 | Temp 97.6°F | Ht 62.0 in | Wt 157.8 lb

## 2017-04-01 DIAGNOSIS — Z7982 Long term (current) use of aspirin: Secondary | ICD-10-CM | POA: Insufficient documentation

## 2017-04-01 DIAGNOSIS — B353 Tinea pedis: Secondary | ICD-10-CM | POA: Insufficient documentation

## 2017-04-01 DIAGNOSIS — B352 Tinea manuum: Secondary | ICD-10-CM | POA: Insufficient documentation

## 2017-04-01 DIAGNOSIS — I10 Essential (primary) hypertension: Secondary | ICD-10-CM | POA: Insufficient documentation

## 2017-04-01 MED ORDER — CLOTRIMAZOLE 1 % EX CREA: 1.0000 "application " | TOPICAL_CREAM | Freq: Two times a day (BID) | CUTANEOUS | 1 refills | Status: DC

## 2017-04-01 NOTE — Patient Instructions (Signed)
Athlete's Foot Athlete's foot (tinea pedis) is a fungal infection of the skin on the feet. It often occurs on the skin that is between or underneath the toes. It can also occur on the soles of the feet. The infection can spread from person to person (is contagious). Follow these instructions at home:  Apply or take over-the-counter and prescription medicines only as told by your doctor.  Keep all follow-up visits as told by your doctor. This is important.  Do not scratch your feet.  Keep your feet dry: ? Wear cotton or wool socks. Change your socks every day or if they become wet. ? Wear shoes that allow air to move around, such as sandals or canvas tennis shoes.  Wash and dry your feet: ? Every day or as told by your doctor. ? After exercising. ? Including the area between your toes.  Wear sandals in wet areas, such as locker rooms and shared showers.  Do not share any of these items: ? Towels. ? Nail clippers. ? Other personal items that touch your feet.  If you have diabetes, keep your blood sugar under control. Contact a doctor if:  You have a fever.  You have swelling, soreness, warmth, or redness in your foot.  You are not getting better with treatment.  Your symptoms get worse.  You have new symptoms. This information is not intended to replace advice given to you by your health care provider. Make sure you discuss any questions you have with your health care provider. Document Released: 12/11/2007 Document Revised: 11/30/2015 Document Reviewed: 12/26/2014 Elsevier Interactive Patient Education  2018 [employer]  

## 2017-04-01 NOTE — Progress Notes (Signed)
Subjective:  Patient ID: Grace [name], female    DOB: 07-15-1965  Age: 51 y.o. MRN: [id number]  CC: Hypertension and Rash (hands and feet)   HPI Grace [name] is a 51 year old female with hypertension who presents today with a rash on her hands and her feet which is slightly pruritic which she noticed one month ago on her feet and then appeared on her palms one and a half weeks ago. She admits to hands being in frequent contact with water. Denies allergies to soaps, creams or foods and she has had no fevers. Not currently using any OTC remedies for this.  Past Medical History:  Diagnosis Date  . Hypertension     No past surgical history on file.  No Known Allergies   Outpatient Medications Prior to Visit  Medication Sig Dispense Refill  . aspirin EC 81 MG tablet Take 1 every other day (Patient taking differently: Take 81 mg by mouth daily. ) 90 tablet 1  . aspirin-acetaminophen-caffeine (EXCEDRIN MIGRAINE) 250-250-65 MG tablet Take 1 tablet by mouth every 6 (six) hours as needed for headache.    . ibuprofen (ADVIL,MOTRIN) 800 MG tablet Take 1 tablet (800 mg total) by mouth every 8 (eight) hours as needed. 21 tablet 0  . lisinopril (PRINIVIL,ZESTRIL) 10 MG tablet Take 1 tablet (10 mg total) by mouth daily. 30 tablet 6   No facility-administered medications prior to visit.     ROS Review of Systems  Constitutional: Negative for activity change, appetite change and fatigue.  HENT: Negative for congestion, sinus pressure and sore throat.   Eyes: Negative for visual disturbance.  Respiratory: Negative for cough, chest tightness, shortness of breath and wheezing.   Cardiovascular: Negative for chest pain and palpitations.  Gastrointestinal: Negative for abdominal distention, abdominal pain and constipation.  Endocrine: Negative for polydipsia.  Genitourinary: Negative.  Negative for dysuria and frequency.  Musculoskeletal: Negative.  Negative for arthralgias and back pain.    Skin: Positive for rash.  Neurological: Negative for tremors, light-headedness and numbness.  Hematological: Does not bruise/bleed easily.  Psychiatric/Behavioral: Negative for agitation, behavioral problems and dysphoric mood.    Objective:  BP 115/73   Pulse 70   Temp 97.6 F (36.4 C) (Oral)   Ht  (1.575 m)   Wt 157 lb 12.8 oz (71.6 kg)   SpO2 98%   BMI 28.86 kg/m   BP/Weight 04/01/2017 02/23/2017 11/08/2016  Systolic BP 115 152 134  Diastolic BP 73 81 77  Wt. (Lbs) 157.8 155 156  BMI 28.86 28.35 28.53      Physical Exam  Constitutional: She is oriented to person, place, and time. She appears well-developed and well-nourished.  Cardiovascular: Normal rate, normal heart sounds and intact distal pulses.   No murmur heard. Pulmonary/Chest: Effort normal and breath sounds normal. She has no wheezes. She has no rales. She exhibits no tenderness.  Abdominal: Soft. Bowel sounds are normal. She exhibits no distension and no mass. There is no tenderness.  Musculoskeletal: Normal range of motion.  Neurological: She is alert and oriented to person, place, and time.  Skin:  Nodular rash on palms of both hands. Hyperpigmented nodular rash on the medial aspect of soles of both feet with associated excoriation and peeling of soles     Assessment & Plan:   1. Tinea pedis of both feet - clotrimazole (LOTRIMIN) 1 % cream; Apply 1 application topically 2 (two) times daily.  Dispense: 60 g; Refill: 1  2. Tinea manuum Discussed skin  care - clotrimazole (LOTRIMIN) 1 % cream; Apply 1 application topically 2 (two) times daily.  Dispense: 60 g; Refill: 1   Meds ordered this encounter  Medications  . clotrimazole (LOTRIMIN) 1 % cream    Sig: Apply 1 application topically 2 (two) times daily.    Dispense:  60 g    Refill:  1    Follow-up: Return in about 2 months (around 06/01/2017) for Follow-up on hypertension.   Jaclyn Shaggy MD

## 2017-07-09 ENCOUNTER — Telehealth: Payer: Self-pay | Admitting: Family Medicine

## 2017-07-09 NOTE — Telephone Encounter (Signed)
Pt called since the medication you told her to by over the counter clotrimazole (LOTRIMIN) 1 % cream Is not working she would like to know if you can prescribe something better and to please follow up

## 2017-07-10 NOTE — Telephone Encounter (Signed)
I prescribed clotrimazole at her last office visit 3 months ago and she will need to be seen to reassess the rash if there has been no improvement.

## 2017-07-18 ENCOUNTER — Other Ambulatory Visit: Payer: Self-pay | Admitting: Family Medicine

## 2017-07-18 DIAGNOSIS — I10 Essential (primary) hypertension: Secondary | ICD-10-CM

## 2017-07-22 ENCOUNTER — Encounter: Payer: Self-pay | Admitting: Family Medicine

## 2017-07-22 ENCOUNTER — Ambulatory Visit: Payer: Self-pay | Attending: Family Medicine | Admitting: Family Medicine

## 2017-07-22 VITALS — BP 135/75 | HR 88 | Temp 97.8°F | Ht 62.0 in | Wt 156.6 lb

## 2017-07-22 DIAGNOSIS — B353 Tinea pedis: Secondary | ICD-10-CM | POA: Insufficient documentation

## 2017-07-22 DIAGNOSIS — I10 Essential (primary) hypertension: Secondary | ICD-10-CM | POA: Insufficient documentation

## 2017-07-22 DIAGNOSIS — Z1211 Encounter for screening for malignant neoplasm of colon: Secondary | ICD-10-CM | POA: Insufficient documentation

## 2017-07-22 DIAGNOSIS — Z79899 Other long term (current) drug therapy: Secondary | ICD-10-CM | POA: Insufficient documentation

## 2017-07-22 DIAGNOSIS — Z7982 Long term (current) use of aspirin: Secondary | ICD-10-CM | POA: Insufficient documentation

## 2017-07-22 MED ORDER — NYSTATIN-TRIAMCINOLONE 100000-0.1 UNIT/GM-% EX OINT: 1.0000 "application " | TOPICAL_OINTMENT | Freq: Two times a day (BID) | CUTANEOUS | 6 refills | Status: DC

## 2017-07-22 MED ORDER — AMLODIPINE BESYLATE 10 MG PO TABS: 10.0000 mg | ORAL_TABLET | Freq: Every day | ORAL | 6 refills | Status: DC

## 2017-07-22 NOTE — Progress Notes (Signed)
Subjective:  Patient ID: Grace [name], female    DOB: 1966/06/03  Age: 52 y.o. MRN: [id number]  CC: Hypertension   HPI Grace [name] is a 52 year old female with hypertension who presents today for a follow up visit.  She has been compliant with Lisinopril for Hypertension but has noticed a dry cough and is having to drink a lot of water to cope with this. Denies post nasal drip, wheezing, dyspnea.  She was treated for a fungal rash in her feet and reports improvement with Clotrimazole but does have a residual rash in her left foot which has been recurrent and would dry up only to become weepy  again. She uses gloves to apply the cream to her feet and wears open shoes at home. She has no rash on her palms.  Past Medical History:  Diagnosis Date  . Hypertension     No past surgical history on file.  No Known Allergies   Outpatient Medications Prior to Visit  Medication Sig Dispense Refill  . aspirin EC 81 MG tablet Take 1 every other day (Patient taking differently: Take 81 mg by mouth daily. ) 90 tablet 1  . aspirin-acetaminophen-caffeine (EXCEDRIN MIGRAINE) 250-250-65 MG tablet Take 1 tablet by mouth every 6 (six) hours as needed for headache.    . ibuprofen (ADVIL,MOTRIN) 800 MG tablet Take 1 tablet (800 mg total) by mouth every 8 (eight) hours as needed. 21 tablet 0  . lisinopril (PRINIVIL,ZESTRIL) 10 MG tablet TAKE 1 TABLET BY MOUTH EVERY DAY 30 tablet 0  . clotrimazole (LOTRIMIN) 1 % cream Apply 1 application topically 2 (two) times daily. (Patient not taking: Reported on 07/22/2017) 60 g 1   No facility-administered medications prior to visit.     ROS Review of Systems  Constitutional: Negative for activity change, appetite change and fatigue.  HENT: Negative for congestion, sinus pressure and sore throat.   Eyes: Negative for visual disturbance.  Respiratory: Negative for cough, chest tightness, shortness of breath and wheezing.   Cardiovascular: Negative for chest  pain and palpitations.  Gastrointestinal: Negative for abdominal distention, abdominal pain and constipation.  Endocrine: Negative for polydipsia.  Genitourinary: Negative for dysuria and frequency.  Musculoskeletal: Negative for arthralgias and back pain.  Skin: Positive for rash.  Neurological: Negative for tremors, light-headedness and numbness.  Hematological: Does not bruise/bleed easily.  Psychiatric/Behavioral: Negative for agitation and behavioral problems.    Objective:  BP 135/75   Pulse 88   Temp 97.8 F (36.6 C) (Oral)   Ht '5\' 2"'  (1.575 m)   Wt 156 lb 9.6 oz (71 kg)   SpO2 97%   BMI 28.64 kg/m   BP/Weight 07/22/2017 04/01/2017 2/64/1583  Systolic BP 094 076 808  Diastolic BP 75 73 81  Wt. (Lbs) 156.6 157.8 155  BMI 28.64 28.86 28.35      Physical Exam  Constitutional: She is oriented to person, place, and time. She appears well-developed and well-nourished.  Cardiovascular: Normal rate, normal heart sounds and intact distal pulses.  No murmur heard. Pulmonary/Chest: Effort normal and breath sounds normal. She has no wheezes. She has no rales. She exhibits no tenderness.  Abdominal: Soft. Bowel sounds are normal. She exhibits no distension and no mass. There is no tenderness.  Musculoskeletal: Normal range of motion.  Neurological: She is alert and oriented to person, place, and time.  Skin:  Fungal rash on medial aspect of left foot with generalized peeling of skin of both feet.  Psychiatric: She has a  normal mood and affect.   CMP Latest Ref Rng & Units 02/23/2017 11/19/2016 09/12/2016  Glucose 65 - 99 mg/dL 109(H) 90 109(H)  BUN 6 - 20 mg/dL '7 14 13  ' Creatinine 0.44 - 1.00 mg/dL 0.74 0.80 0.68  Sodium 135 - 145 mmol/L 137 144 140  Potassium 3.5 - 5.1 mmol/L 4.4 4.2 3.5  Chloride 101 - 111 mmol/L 102 101 104  CO2 22 - 32 mmol/L '24 28 25  ' Calcium 8.9 - 10.3 mg/dL 9.9 10.4(H) 10.3  Total Protein 6.0 - 8.5 g/dL - 7.1 -  Total Bilirubin 0.0 - 1.2 mg/dL - <0.2 -   Alkaline Phos 39 - 117 IU/L - 46 -  AST 0 - 40 IU/L - 11 -  ALT 0 - 32 IU/L - 8 -    Lipid Panel     Component Value Date/Time   CHOL 186 03/28/2016 0904   TRIG 87 03/28/2016 0904   HDL 48 03/28/2016 0904   CHOLHDL 3.9 03/28/2016 0904   VLDL 17 03/28/2016 0904   LDLCALC 121 03/28/2016 0904     Assessment & Plan:   1. Essential hypertension Controlled Switched from Lisinopril to Amlodipine due to cough with the former - CMP14+EGFR; Future - Lipid panel; Future - amLODipine (NORVASC) 10 MG tablet; Take 1 tablet (10 mg total) by mouth daily.  Dispense: 30 tablet; Refill: 6  2. Tinea pedis of left foot Improving Discussed skin care - nystatin-triamcinolone ointment (MYCOLOG); Apply 1 application topically 2 (two) times daily. Discontinue Clotrimazole  Dispense: 30 g; Refill: 6  3. Screening for colon cancer - Ambulatory referral to Gastroenterology  Provided information for rescheduling mammogram appointment.  Meds ordered this encounter  Medications  . amLODipine (NORVASC) 10 MG tablet    Sig: Take 1 tablet (10 mg total) by mouth daily.    Dispense:  30 tablet    Refill:  6    Discontinue Lisinopril  . nystatin-triamcinolone ointment (MYCOLOG)    Sig: Apply 1 application topically 2 (two) times daily. Discontinue Clotrimazole    Dispense:  30 g    Refill:  6    Follow-up: Return in about 6 months (around 01/19/2018) for follow up of Hypertension.   Arnoldo Morale MD

## 2017-07-22 NOTE — Patient Instructions (Addendum)
Contact [facility] for Mammogram [phone]       Athlete's Foot Athlete's foot (tinea pedis) is a fungal infection of the skin on the feet. It often occurs on the skin that is between or underneath the toes. It can also occur on the soles of the feet. The infection can spread from person to person (is contagious). What are the causes? Athlete's foot is caused by a fungus. This fungus grows in warm, moist places. Most people get athlete's foot by sharing shower stalls, towels, and wet floors with someone who is infected. Not washing your feet or changing your socks often enough can contribute to athlete's foot. What increases the risk? This condition is more likely to develop in:  Men.  People who have a weak body defense system (immune system).  People who have diabetes.  People who use public showers, such as at a gym.  People who wear heavy-duty shoes, such as [employer] workerindustrial or military shoes.  Seasons with warm, humid weather.  What are the signs or symptoms? Symptoms of this condition include:  Itchy areas between the toes or on the soles of the feet.  White, flaky, or scaly areas between the toes or on the soles of the feet.  Very itchy small blisters between the toes or on the soles of the feet.  Small cuts on the skin. These cuts can become infected.  Thick or discolored toenails.  How is this diagnosed? This condition is diagnosed with a medical history and physical exam. Your health care provider may also take a skin or toenail sample to be examined. How is this treated? Treatment for this condition includes antifungal medicines. These may be applied as powders, ointments, or creams. In severe cases, an oral antifungal medicine may be given. Follow these instructions at home:  Apply or take over-the-counter and prescription medicines only as told by your health care provider.  Keep all follow-up visits as told by your health care provider. This is important.  Do not  scratch your feet.  Keep your feet dry: ? Wear cotton or wool socks. Change your socks every day or if they become wet. ? Wear shoes that allow air to circulate, such as sandals or canvas tennis shoes.  Wash and dry your feet: ? Every day or as told by your health care provider. ? After exercising. ? Including the area between your toes.  Do not share towels, nail clippers, or other personal items that touch your feet with others.  If you have diabetes, keep your blood sugar under control. How is this prevented?  Do not share towels.  Wear sandals in wet areas, such as locker rooms and shared showers.  Keep your feet dry: ? Wear cotton or wool socks. Change your socks every day or if they become wet. ? Wear shoes that allow air to circulate, such as sandals or canvas tennis shoes.  Wash and dry your feet after exercising. Pay attention to the area between your toes. Contact a health care provider if:  You have a fever.  You have swelling, soreness, warmth, or redness in your foot.  You are not getting better with treatment.  Your symptoms get worse.  You have new symptoms. This information is not intended to replace advice given to you by your health care provider. Make sure you discuss any questions you have with your health care provider. Document Released: 06/21/2000 Document Revised: 11/30/2015 Document Reviewed: 12/26/2014 Elsevier Interactive Patient Education  2018 [employer]

## 2017-07-27 IMAGING — CT CT HEAD W/O CM
3 of 4 series · 14 of 47 positions shown, 16 images · non-contrast
Comparison: CT of the head dated 03/29/2009.

CLINICAL DATA: 50 y/o F; 3 days of hypertension with
lightheadedness. A fall

EXAM:
CT HEAD WITHOUT CONTRAST
TECHNIQUE: Contiguous axial images were obtained from the base of the skull
through the vertex without intravenous contrast.

[Series 2: head w/o · axial · non-contrast · 0.45mm/px · z∈[-116,-1]mm · 8 of 29 slices shown, 10 images]
[im 3/29  brain]
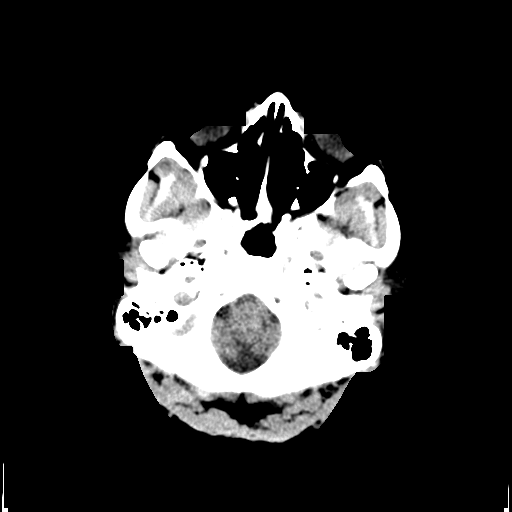
[im 3/29  bone]
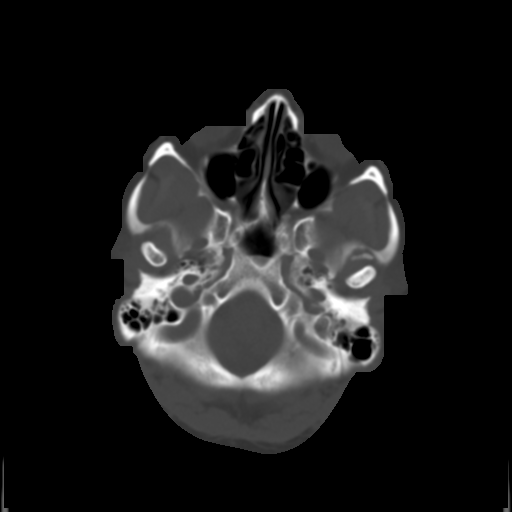
[im 6/29  brain]
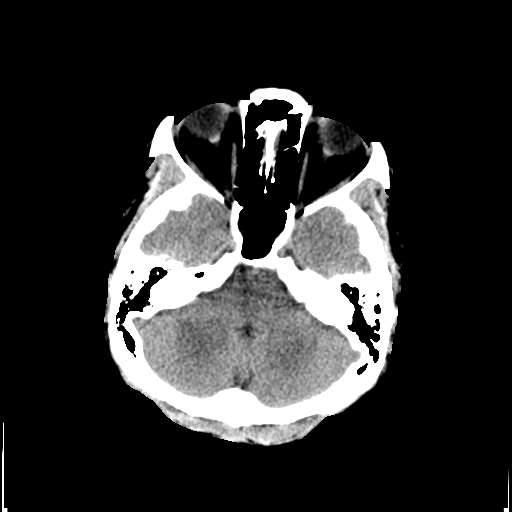
[im 9/29  brain]
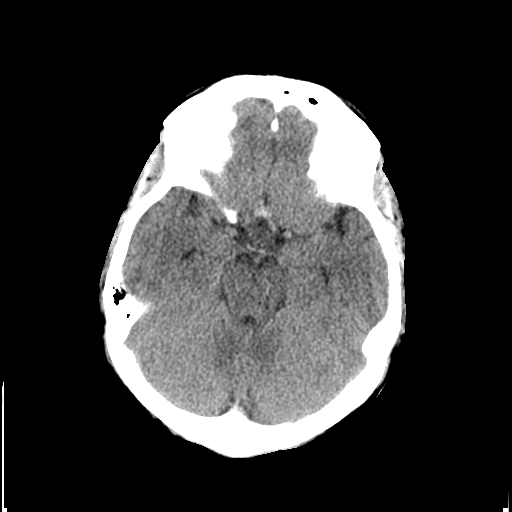
[im 12/29  brain]
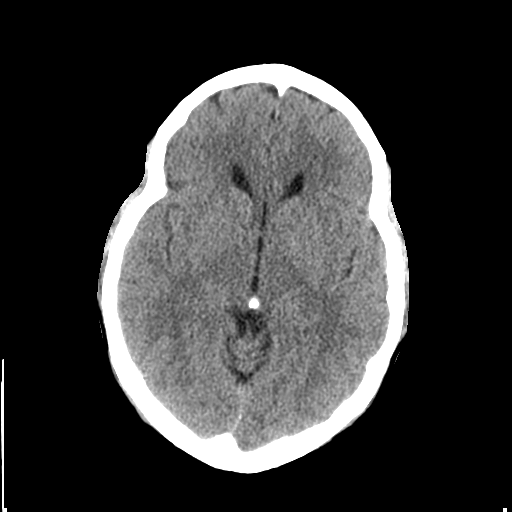
[im 17/29  brain]
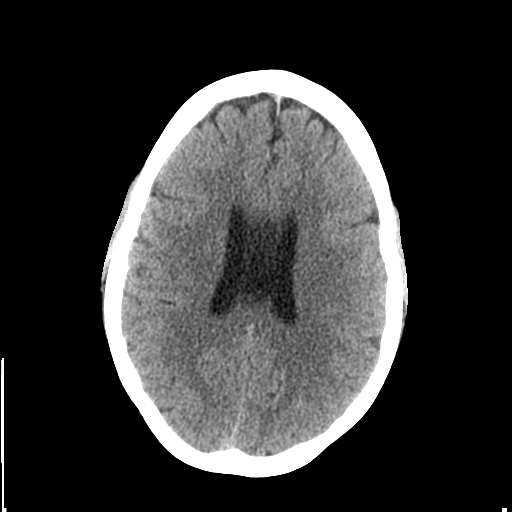
[im 17/29  bone]
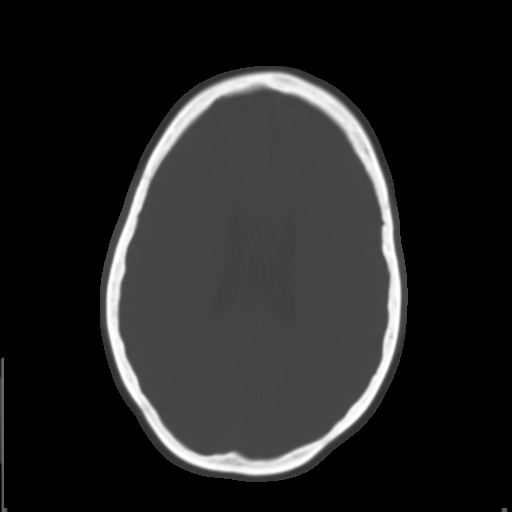
[im 20/29  brain]
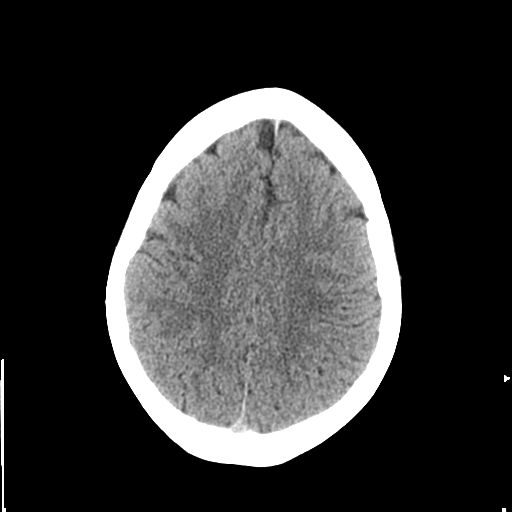
[im 23/29  brain]
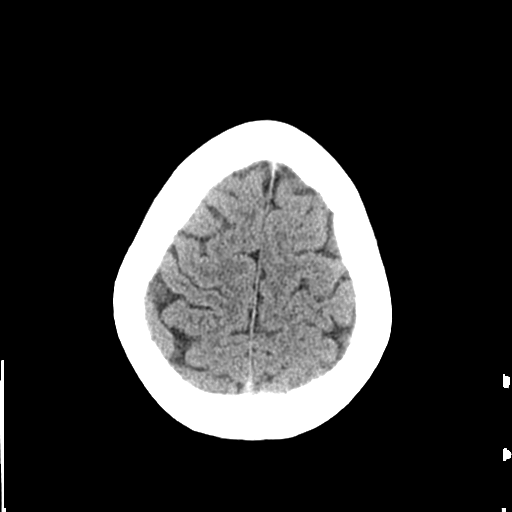
[im 26/29  brain]
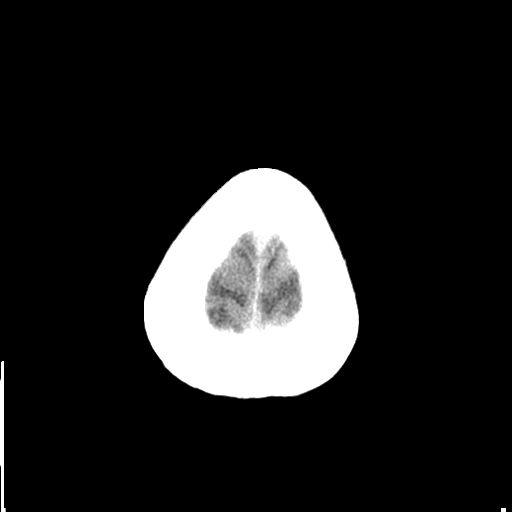

[Series 5: coronal · coronal · 0.30mm/px · 3 of 71 slices shown]
[im 24/71  brain]
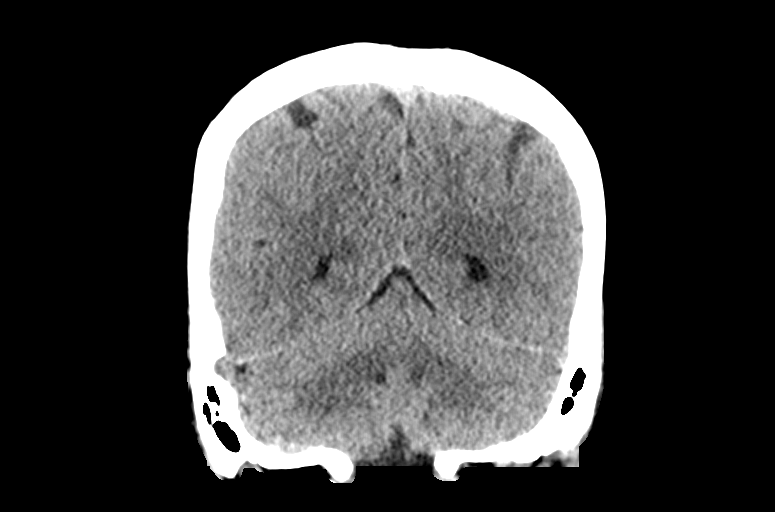
[im 32/71  brain]
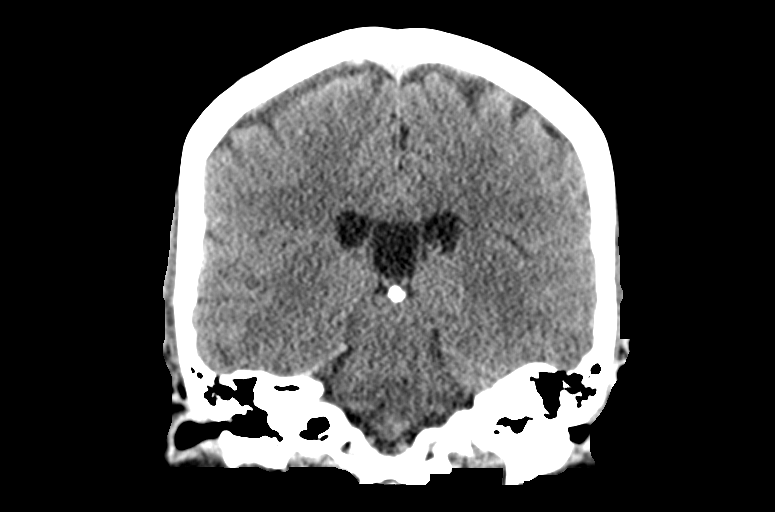
[im 39/71  brain]
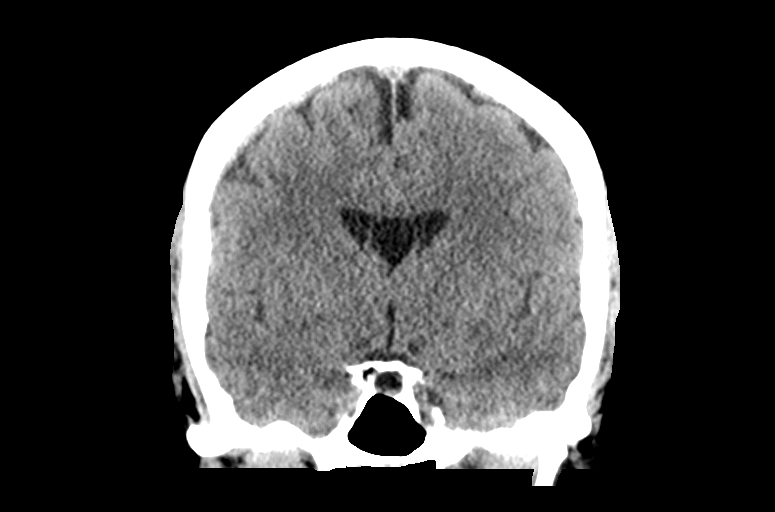

[Series 6: sagittal · sagittal · 0.30mm/px · 3 of 54 slices shown]
[im 18/54  brain]
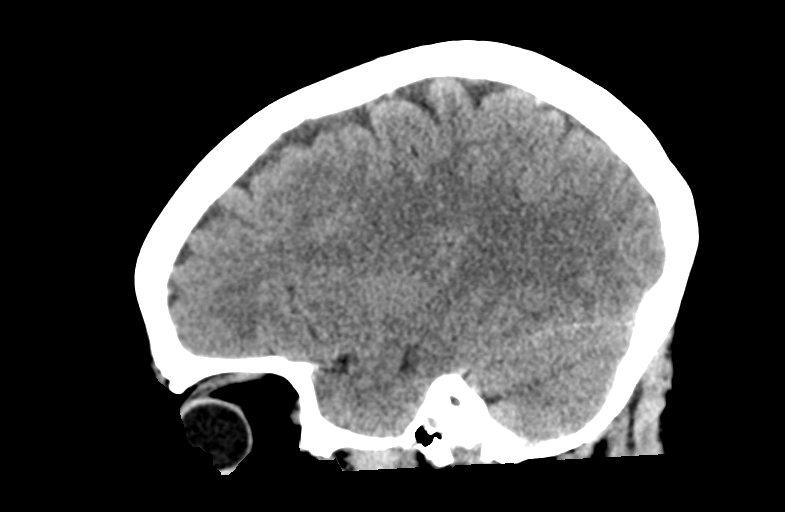
[im 27/54  brain]
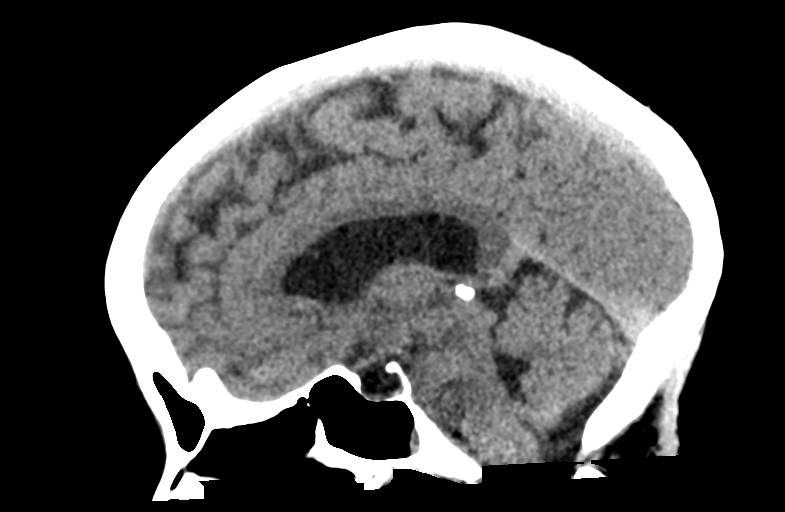
[im 36/54  brain]
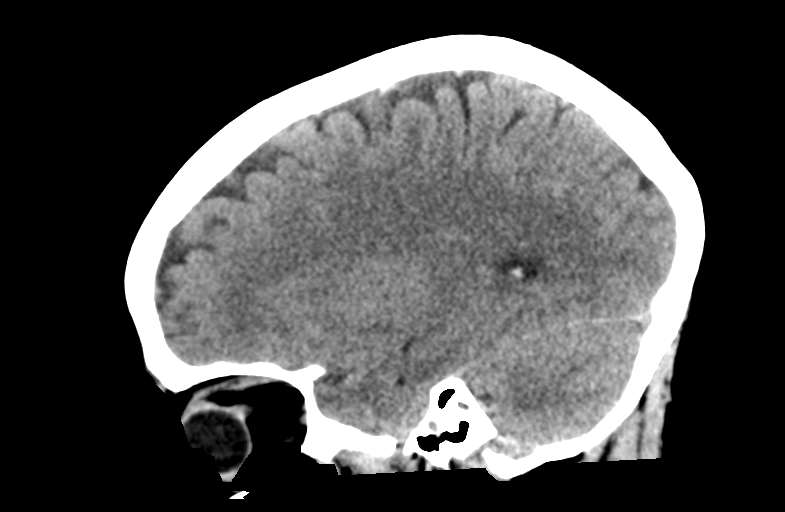

[14 of 47 positions shown; findings below may reference images not displayed]

FINDINGS: Brain: No evidence of acute infarction, hemorrhage, hydrocephalus,
extra-axial collection or mass lesion/mass effect. Cavum septum
pellucidum.

Vascular: No hyperdense vessel or unexpected calcification.

Skull: No displaced skull fracture.  No suspicious osseous lesion.

Sinuses/Orbits: Minimal mucosal thickening within the sphenoid
sinus, otherwise the visualized paranasal sinuses, mastoid air
cells, and orbits are unremarkable

Other: None.
IMPRESSION: No acute intracranial abnormality is identified. No significant
interval change.

By: Bubito Y Punto M.D.

## 2017-07-29 ENCOUNTER — Telehealth: Payer: Self-pay | Admitting: Family Medicine

## 2017-07-29 ENCOUNTER — Ambulatory Visit: Payer: Self-pay | Attending: Family Medicine

## 2017-07-29 DIAGNOSIS — I10 Essential (primary) hypertension: Secondary | ICD-10-CM | POA: Insufficient documentation

## 2017-07-29 MED ORDER — NYSTATIN-TRIAMCINOLONE 100000-0.1 UNIT/GM-% EX CREA: 1.0000 "application " | TOPICAL_CREAM | Freq: Two times a day (BID) | CUTANEOUS | 6 refills | Status: DC

## 2017-07-29 NOTE — Progress Notes (Signed)
Patient here for lab visit only 

## 2017-07-29 NOTE — Telephone Encounter (Signed)
Switched to cream and [facility]

## 2017-07-29 NOTE — Telephone Encounter (Signed)
Pt. Came to the office stating that she was prescribed nystatin-triamcinolone ointment (MYCOLOG), Rx was sent to [facility] pharmacy and it cost her $92.  [facility] and they have the cream and pt. Could get the medication for free. Pt. Would like for her Rx to be changed from the ointment to the cream and [facility]. Please f/u

## 2017-07-30 LAB — CMP14+EGFR
ALT: 7 IU/L (ref 0–32)
AST: 14 IU/L (ref 0–40)
Albumin/Globulin Ratio: 1.9 (ref 1.2–2.2)
Albumin: 5 g/dL (ref 3.5–5.5)
Alkaline Phosphatase: 64 IU/L (ref 39–117)
BUN/Creatinine Ratio: 14 (ref 9–23)
BUN: 10 mg/dL (ref 6–24)
Bilirubin Total: 0.2 mg/dL (ref 0.0–1.2)
CO2: 22 mmol/L (ref 20–29)
Calcium: 10.1 mg/dL (ref 8.7–10.2)
Chloride: 100 mmol/L (ref 96–106)
Creatinine, Ser: 0.74 mg/dL (ref 0.57–1.00)
GFR calc Af Amer: 108 mL/min/{1.73_m2} (ref >59)
GFR calc non Af Amer: 93 mL/min/{1.73_m2} (ref >59)
Globulin, Total: 2.7 g/dL (ref 1.5–4.5)
Glucose: 109 mg/dL — ABNORMAL HIGH (ref 65–99)
Potassium: 4.4 mmol/L (ref 3.5–5.2)
Sodium: 143 mmol/L (ref 134–144)
Total Protein: 7.7 g/dL (ref 6.0–8.5)

## 2017-07-30 LAB — LIPID PANEL
Chol/HDL Ratio: 3.8 ratio (ref 0.0–4.4)
Cholesterol, Total: 203 mg/dL — ABNORMAL HIGH (ref 100–199)
HDL: 53 mg/dL (ref >39)
LDL Calculated: 136 mg/dL — ABNORMAL HIGH (ref 0–99)
Triglycerides: 69 mg/dL (ref 0–149)
VLDL Cholesterol Cal: 14 mg/dL (ref 5–40)

## 2017-08-01 ENCOUNTER — Telehealth: Payer: Self-pay

## 2017-08-01 NOTE — Telephone Encounter (Signed)
Patient was called and informed of lab results. 

## 2017-12-18 ENCOUNTER — Ambulatory Visit: Payer: Self-pay

## 2018-01-24 ENCOUNTER — Other Ambulatory Visit: Payer: Self-pay | Admitting: Family Medicine

## 2018-01-24 DIAGNOSIS — I10 Essential (primary) hypertension: Secondary | ICD-10-CM

## 2018-02-19 ENCOUNTER — Other Ambulatory Visit: Payer: Self-pay | Admitting: Family Medicine

## 2018-02-19 DIAGNOSIS — I10 Essential (primary) hypertension: Secondary | ICD-10-CM

## 2018-02-19 NOTE — Telephone Encounter (Signed)
Called patient and scheduled appointment.

## 2018-02-25 ENCOUNTER — Ambulatory Visit: Payer: Self-pay | Attending: Family Medicine | Admitting: Physician Assistant

## 2018-02-25 VITALS — BP 120/85 | HR 83 | Temp 98.2°F | Resp 16 | Wt 159.6 lb

## 2018-02-25 DIAGNOSIS — R21 Rash and other nonspecific skin eruption: Secondary | ICD-10-CM | POA: Insufficient documentation

## 2018-02-25 DIAGNOSIS — Z79899 Other long term (current) drug therapy: Secondary | ICD-10-CM | POA: Insufficient documentation

## 2018-02-25 DIAGNOSIS — I10 Essential (primary) hypertension: Secondary | ICD-10-CM | POA: Insufficient documentation

## 2018-02-25 MED ORDER — AMLODIPINE BESYLATE 10 MG PO TABS: 10.0000 mg | ORAL_TABLET | Freq: Every day | ORAL | 5 refills | Status: DC

## 2018-02-25 MED ORDER — ASPIRIN EC 81 MG PO TBEC: DELAYED_RELEASE_TABLET | ORAL | 1 refills | Status: DC

## 2018-02-25 NOTE — Progress Notes (Signed)
Grace [name], is a 52 y.o. female  ZOX:[id number]:[id number]  [id number]  DOB - 10-Sep-1965  Subjective:  Chief Complaint and HPI: Grace [name] is a 52 y.o. female here today for BP med RF.  No CP/dizziness/HA.    Also has rash on R foot that has failed lotrimin and nyastatin/triamcinilone.  No f/c.    ROS:   Constitutional:  No f/c, No night sweats, No unexplained weight loss. EENT:  No vision changes, No blurry vision, No hearing changes. No mouth, throat, or ear problems.  Respiratory: No cough, No SOB Cardiac: No CP, no palpitations GI:  No abd pain, No N/V/D. GU: No Urinary s/sx Musculoskeletal: No joint pain Neuro: No headache, no dizziness, no motor weakness.  Skin: + rash Endocrine:  No polydipsia. No polyuria.  Psych: Denies SI/HI  No problems updated.  ALLERGIES: No Known Allergies  PAST MEDICAL HISTORY: Past Medical History:  Diagnosis Date  . Hypertension     MEDICATIONS AT HOME: Prior to Admission medications   Medication Sig Start Date End Date Taking? Authorizing Provider  amLODipine (NORVASC) 10 MG tablet Take 1 tablet (10 mg total) by mouth daily. 02/25/18   Anders SimmondsMcClung, Shanielle Correll M, PA-C  aspirin EC 81 MG tablet Take 1 every other day 02/25/18   Anders SimmondsMcClung, Massiel Stipp M, PA-C  aspirin-acetaminophen-caffeine (EXCEDRIN MIGRAINE) [phone] MG tablet Take 1 tablet by mouth every 6 (six) hours as needed for headache.    [provider]  nystatin-triamcinolone (MYCOLOG II) cream Apply 1 application topically 2 (two) times daily. Patient not taking: Reported on 02/25/2018 07/29/17   Hoy RegisterNewlin, Enobong, MD     Objective:  EXAM:   Vitals:   02/25/18 1611  BP: 120/85  Pulse: 83  Resp: 16  Temp: 98.2 F (36.8 C)  TempSrc: Oral  SpO2: 100%  Weight: 159 lb 9.6 oz (72.4 kg)    General appearance : A&OX3. NAD. Non-toxic-appearing HEENT: Atraumatic and Normocephalic.  PERRLA. EOM intact.   Neck: supple, no JVD. No cervical lymphadenopathy. No thyromegaly Chest/Lungs:   Breathing-non-labored, Good air entry bilaterally, breath sounds normal without rales, rhonchi, or wheezing  CVS: S1 S2 regular, no murmurs, gallops, rubs  Extremities: Bilateral Lower Ext shows no edema, both legs are warm to touch with = pulse throughout Neurology:  CN II-XII grossly intact, Non focal.   Psych:  TP linear. J/I WNL. Normal speech. Appropriate eye contact and affect.  Skin:  R lateral foot with 3X5cm area of erythema and healed areas of excoriation.    Data Review No results found for: HGBA1C   Assessment & Plan   1. Rash and nonspecific skin eruption Stop using any creams 1-2 weeks prior to derma appt.  She has failed lotrimin and mycolog.  May need biopsy.   - Ambulatory referral to Dermatology  2. Essential hypertension Controlled.  Continue current regimen.   - amLODipine (NORVASC) 10 MG tablet; Take 1 tablet (10 mg total) by mouth daily.  Dispense: 90 tablet; Refill: 5 - aspirin EC 81 MG tablet; Take 1 every other day  Dispense: 90 tablet; Refill: 1 - Comprehensive metabolic panel   Patient have been counseled extensively about nutrition and exercise  Return in about 6 months (around 08/28/2018) for fasting bloodwork Dr Alvis LemmingsNewlin for fasting bloodwork and htn.  The patient was given clear instructions to go to ER or return to medical center if symptoms don't improve, worsen or new problems develop. The patient verbalized understanding. The patient was told to call to get lab results if  they haven't heard anything in the next week.     Georgian CoAngela Nelli Swalley, PA-C [facility], [place] [phone]   02/25/2018, 4:20 PM

## 2018-02-25 NOTE — Patient Instructions (Addendum)
Stop using foot cream 1-2 weeks prior to your dermatology appt.

## 2018-08-30 ENCOUNTER — Emergency Department (HOSPITAL_COMMUNITY): Payer: Self-pay

## 2018-08-30 ENCOUNTER — Encounter (HOSPITAL_COMMUNITY): Payer: Self-pay | Admitting: Emergency Medicine

## 2018-08-30 ENCOUNTER — Other Ambulatory Visit: Payer: Self-pay

## 2018-08-30 ENCOUNTER — Emergency Department (HOSPITAL_COMMUNITY)
Admission: EM | Admit: 2018-08-30 | Discharge: 2018-08-30 | Disposition: A | Discharge status: Home / Self Care | Payer: Self-pay | Attending: Emergency Medicine | Admitting: Emergency Medicine

## 2018-08-30 DIAGNOSIS — Z7982 Long term (current) use of aspirin: Secondary | ICD-10-CM | POA: Insufficient documentation

## 2018-08-30 DIAGNOSIS — I10 Essential (primary) hypertension: Secondary | ICD-10-CM | POA: Insufficient documentation

## 2018-08-30 DIAGNOSIS — Z79899 Other long term (current) drug therapy: Secondary | ICD-10-CM | POA: Insufficient documentation

## 2018-08-30 DIAGNOSIS — R6889 Other general symptoms and signs: Secondary | ICD-10-CM

## 2018-08-30 DIAGNOSIS — R5383 Other fatigue: Secondary | ICD-10-CM

## 2018-08-30 DIAGNOSIS — R112 Nausea with vomiting, unspecified: Secondary | ICD-10-CM

## 2018-08-30 DIAGNOSIS — J111 Influenza due to unidentified influenza virus with other respiratory manifestations: Secondary | ICD-10-CM | POA: Insufficient documentation

## 2018-08-30 DIAGNOSIS — Z3202 Encounter for pregnancy test, result negative: Secondary | ICD-10-CM | POA: Insufficient documentation

## 2018-08-30 LAB — URINALYSIS, ROUTINE W REFLEX MICROSCOPIC
Bacteria, UA: NONE SEEN
Bilirubin Urine: NEGATIVE
Glucose, UA: NEGATIVE mg/dL
Hgb urine dipstick: NEGATIVE
Ketones, ur: 5 mg/dL — AB
Leukocytes,Ua: NEGATIVE
Nitrite: NEGATIVE
Protein, ur: 100 mg/dL — AB
Specific Gravity, Urine: 1.028 (ref 1.005–1.030)
pH: 6 (ref 5.0–8.0)

## 2018-08-30 LAB — CBC
HCT: 47.2 % — ABNORMAL HIGH (ref 36.0–46.0)
Hemoglobin: 14.7 g/dL (ref 12.0–15.0)
MCH: 28.1 pg (ref 26.0–34.0)
MCHC: 31.1 g/dL (ref 30.0–36.0)
MCV: 90.1 fL (ref 80.0–100.0)
Platelets: 326 10*3/uL (ref 150–400)
RBC: 5.24 MIL/uL — ABNORMAL HIGH (ref 3.87–5.11)
RDW: 13.5 % (ref 11.5–15.5)
WBC: 4.6 10*3/uL (ref 4.0–10.5)
nRBC: 0 % (ref 0.0–0.2)

## 2018-08-30 LAB — COMPREHENSIVE METABOLIC PANEL
ALT: 20 U/L (ref 0–44)
AST: 39 U/L (ref 15–41)
Albumin: 4.6 g/dL (ref 3.5–5.0)
Alkaline Phosphatase: 58 U/L (ref 38–126)
Anion gap: 14 (ref 5–15)
BUN: 20 mg/dL (ref 6–20)
CO2: 25 mmol/L (ref 22–32)
Calcium: 10.3 mg/dL (ref 8.9–10.3)
Chloride: 102 mmol/L (ref 98–111)
Creatinine, Ser: 0.75 mg/dL (ref 0.44–1.00)
GFR calc Af Amer: >60 mL/min (ref >60)
GFR calc non Af Amer: >60 mL/min (ref >60)
Glucose, Bld: 132 mg/dL — ABNORMAL HIGH (ref 70–99)
Potassium: 3.2 mmol/L — ABNORMAL LOW (ref 3.5–5.1)
Sodium: 141 mmol/L (ref 135–145)
Total Bilirubin: 0.5 mg/dL (ref 0.3–1.2)
Total Protein: 8.6 g/dL — ABNORMAL HIGH (ref 6.5–8.1)

## 2018-08-30 LAB — PREGNANCY, URINE: Preg Test, Ur: NEGATIVE

## 2018-08-30 LAB — LIPASE, BLOOD: Lipase: 32 U/L (ref 11–51)

## 2018-08-30 MED ORDER — PROMETHAZINE HCL 25 MG PO TABS: 25.0000 mg | ORAL_TABLET | Freq: Four times a day (QID) | ORAL | 0 refills | Status: DC | PRN

## 2018-08-30 MED ORDER — ALBUTEROL SULFATE HFA 108 (90 BASE) MCG/ACT IN AERS
2.0000 | INHALATION_SPRAY | Freq: Once | RESPIRATORY_TRACT | Status: AC
  Administered 2018-08-30: 2 via RESPIRATORY_TRACT
  Filled 2018-08-30: qty 6.7

## 2018-08-30 MED ORDER — ONDANSETRON HCL 4 MG/2ML IJ SOLN
4.0000 mg | Freq: Once | INTRAMUSCULAR | Status: AC
  Administered 2018-08-30: 4 mg via INTRAVENOUS
  Filled 2018-08-30: qty 2

## 2018-08-30 MED ORDER — SODIUM CHLORIDE 0.9 % IV BOLUS
1000.0000 mL | Freq: Once | INTRAVENOUS | Status: AC
  Administered 2018-08-30: 1000 mL via INTRAVENOUS

## 2018-08-30 MED ORDER — IPRATROPIUM BROMIDE 0.02 % IN SOLN
0.5000 mg | Freq: Once | RESPIRATORY_TRACT | Status: AC
  Administered 2018-08-30: 0.5 mg via RESPIRATORY_TRACT
  Filled 2018-08-30: qty 2.5

## 2018-08-30 MED ORDER — ALBUTEROL SULFATE (2.5 MG/3ML) 0.083% IN NEBU
5.0000 mg | INHALATION_SOLUTION | Freq: Once | RESPIRATORY_TRACT | Status: AC
  Administered 2018-08-30: 5 mg via RESPIRATORY_TRACT
  Filled 2018-08-30: qty 6

## 2018-08-30 MED ORDER — ONDANSETRON 4 MG PO TBDP: 4.0000 mg | ORAL_TABLET | Freq: Three times a day (TID) | ORAL | 0 refills | Status: DC | PRN

## 2018-08-30 MED ORDER — ALBUTEROL SULFATE HFA 108 (90 BASE) MCG/ACT IN AERS: 1.0000 | INHALATION_SPRAY | RESPIRATORY_TRACT | 0 refills | Status: DC | PRN

## 2018-08-30 MED ORDER — PROMETHAZINE HCL 25 MG/ML IJ SOLN
25.0000 mg | Freq: Once | INTRAMUSCULAR | Status: AC
  Administered 2018-08-30: 25 mg via INTRAVENOUS
  Filled 2018-08-30: qty 1

## 2018-08-30 NOTE — ED Notes (Signed)
ED Provider at bedside. 

## 2018-08-30 NOTE — ED Provider Notes (Signed)
[facility]-EMERGENCY DEPT Provider Note   CSN: [id number] Arrival date & time: 08/30/18  1417    History   Chief Complaint Chief Complaint  Patient presents with  . Chills  . Emesis  . Fatigue    HPI    Grace [name] is a 53 y.o. female with a PMHx of HTN, who presents to the ED with complaints of chills, cough white white/green sputum production, rhinorrhea, fatigue, and not feeling well that began Tuesday, 5 days ago.  She states that she started having the symptoms on Tuesday, then on Thursday, 3 days ago, she ate chili from [facility] and started having nausea and vomiting.  She has been having about 2 episodes of nonbloody nonbilious emesis per day since then, but she has not vomited since last night.  She does not feel well so she decided to come in for evaluation.  She has tried Mucinex without relief, took an antacid with some relief of her nausea and vomiting, but there are no known aggravating factors.  She is a non-smoker, has not traveled recently, no known sick contacts, and did not receive her flu shot this year.  She denies any sore throat, ear pain or drainage, fevers, chest pain, shortness of breath, abdominal pain, hematemesis, diarrhea, constipation, melena, hematochezia, dysuria, hematuria, myalgias, arthralgias, numbness, tingling, focal weakness, or any other complaints at this time.  The history is provided by the patient and medical records. No language interpreter was used.  Emesis  Associated symptoms: chills and cough   Associated symptoms: no abdominal pain, no arthralgias, no diarrhea, no fever, no myalgias and no sore throat     Past Medical History:  Diagnosis Date  . Hypertension     Patient Active Problem List   Diagnosis Date Noted  . Tinea pedis 07/22/2017  . Hypertension 03/25/2016    History reviewed. No pertinent surgical history.   OB History   No obstetric history on file.      Home Medications    Prior to  Admission medications   Medication Sig Start Date End Date Taking? Authorizing Provider  amLODipine (NORVASC) 10 MG tablet Take 1 tablet (10 mg total) by mouth daily. 02/25/18  Yes Anders Simmonds, PA-C  aspirin EC 81 MG tablet Take 1 every other day 02/25/18  Yes Anders Simmonds, PA-C  aspirin-acetaminophen-caffeine (EXCEDRIN MIGRAINE) [phone] MG tablet Take 1 tablet by mouth every 6 (six) hours as needed for headache.   Yes [provider]  nystatin-triamcinolone (MYCOLOG II) cream Apply 1 application topically 2 (two) times daily. Patient not taking: Reported on 02/25/2018 07/29/17   Hoy Register, MD    Family History No family history on file.  Social History Social History   Tobacco Use  . Smoking status: Never Smoker  . Smokeless tobacco: Never Used  Substance Use Topics  . Alcohol use: Yes    Comment: occasionally  . Drug use: No     Allergies   Patient has no known allergies.   Review of Systems Review of Systems  Constitutional: Positive for chills and fatigue. Negative for fever.  HENT: Positive for rhinorrhea. Negative for ear discharge, ear pain and sore throat.   Respiratory: Positive for cough. Negative for shortness of breath.   Cardiovascular: Negative for chest pain.  Gastrointestinal: Positive for nausea and vomiting. Negative for abdominal pain, blood in stool, constipation and diarrhea.  Genitourinary: Negative for dysuria and hematuria.  Musculoskeletal: Negative for arthralgias and myalgias.  Skin: Negative for  color change.  Allergic/Immunologic: Negative for immunocompromised state.  Neurological: Negative for weakness and numbness.  Psychiatric/Behavioral: Negative for confusion.   All other systems reviewed and are negative for acute change except as noted in the HPI.    Physical Exam Updated Vital Signs BP (!) 164/111 (BP Location: Right Arm) Comment: pt sts she didnt take bp meds today  Pulse (!) 102   Temp 97.6 F (36.4 C)  (Oral)   Resp 20   SpO2 98%   Physical Exam Vitals signs and nursing note reviewed.  Constitutional:      General: She is not in acute distress.    Appearance: Normal appearance. She is well-developed. She is not toxic-appearing.     Comments: Afebrile, nontoxic, NAD  HENT:     Head: Normocephalic and atraumatic.     Mouth/Throat:     Mouth: Mucous membranes are dry.     Comments: Dry lips Eyes:     General:        Right eye: No discharge.        Left eye: No discharge.     Conjunctiva/sclera: Conjunctivae normal.  Neck:     Musculoskeletal: Normal range of motion and neck supple.  Cardiovascular:     Rate and Rhythm: Regular rhythm. Tachycardia present.     Pulses: Normal pulses.     Heart sounds: Normal heart sounds, S1 normal and S2 normal. No murmur. No friction rub. No gallop.      Comments: Borderline tachycardia HR low 100s Pulmonary:     Effort: Pulmonary effort is normal. No respiratory distress.     Breath sounds: Wheezing and rhonchi present. No decreased breath sounds or rales.     Comments: Scattered rhonchi and faint expiratory wheezing throughout, no rales, no hypoxia or increased WOB, speaking in full sentences, SpO2 98% on RA  Abdominal:     General: Bowel sounds are normal. There is no distension.     Palpations: Abdomen is soft. Abdomen is not rigid.     Tenderness: There is no abdominal tenderness. There is no right CVA tenderness, left CVA tenderness, guarding or rebound. Negative signs include Murphy's sign and McBurney's sign.     Comments: Soft, NTND, +BS throughout, no r/g/r, neg murphy's, neg mcburney's, no CVA TTP   Musculoskeletal: Normal range of motion.  Skin:    General: Skin is warm and dry.     Findings: No rash.  Neurological:     Mental Status: She is alert and oriented to person, place, and time.     Sensory: Sensation is intact. No sensory deficit.     Motor: Motor function is intact.  Psychiatric:        Mood and Affect: Mood and  affect normal.        Behavior: Behavior normal.      ED Treatments / Results  Labs (all labs ordered are listed, but only abnormal results are displayed) Labs Reviewed  COMPREHENSIVE METABOLIC PANEL - Abnormal; Notable for the following components:      Result Value   Potassium 3.2 (*)    Glucose, Bld 132 (*)    Total Protein 8.6 (*)    All other components within normal limits  CBC - Abnormal; Notable for the following components:   RBC 5.24 (*)    HCT 47.2 (*)    All other components within normal limits  URINALYSIS, ROUTINE W REFLEX MICROSCOPIC - Abnormal; Notable for the following components:   APPearance HAZY (*)  Ketones, ur 5 (*)    Protein, ur 100 (*)    All other components within normal limits  LIPASE, BLOOD  PREGNANCY, URINE    EKG None  Radiology Dg Chest 2 View  Result Date: 08/30/2018 CLINICAL DATA:  Cough EXAM: CHEST - 2 VIEW COMPARISON:  None. FINDINGS: Normal heart size. Normal mediastinal contour. No pneumothorax. No pleural effusion. Lungs appear clear, with no acute consolidative airspace disease and no pulmonary edema. IMPRESSION: No active cardiopulmonary disease. Electronically Signed   By: Delbert Phenix M.D.   On: 08/30/2018 17:48    Procedures Procedures (including critical care time)  Medications Ordered in ED Medications  ondansetron (ZOFRAN) injection 4 mg (4 mg Intravenous Given 08/30/18 1646)  sodium chloride 0.9 % bolus 1,000 mL (0 mLs Intravenous Stopped 08/30/18 1808)  albuterol (PROVENTIL) (2.5 MG/3ML) 0.083% nebulizer solution 5 mg (5 mg Nebulization Given 08/30/18 1646)  ipratropium (ATROVENT) nebulizer solution 0.5 mg (0.5 mg Nebulization Given 08/30/18 1646)  ipratropium (ATROVENT) nebulizer solution 0.5 mg (0.5 mg Nebulization Given 08/30/18 1832)  albuterol (PROVENTIL) (2.5 MG/3ML) 0.083% nebulizer solution 5 mg (5 mg Nebulization Given 08/30/18 1832)  promethazine (PHENERGAN) injection 25 mg (25 mg Intravenous Given 08/30/18 1912)    albuterol (PROVENTIL HFA;VENTOLIN HFA) 108 (90 Base) MCG/ACT inhaler 2 puff (2 puffs Inhalation Given 08/30/18 1950)     Initial Impression / Assessment and Plan / ED Course  I have reviewed the triage vital signs and the nursing notes.  Pertinent labs & imaging results that were available during my care of the patient were reviewed by me and considered in my medical decision making (see chart for details).        53 y.o. female here with chills and not feeling well for about 5 days with associated cough and rhinorrhea, and then 3 days of nausea and vomiting after eating chili, feeling fatigued.  On exam, marginally tachycardic in the low 100s, dry lips, no abdominal tenderness, lung sounds with scattered rhonchi and faint wheezing.  Work-up thus far reveals lipase within normal limits, CMP with K 3.2 but doubt need for repletion, CBC within normal limits.  Will give fluids, Zofran, duo nebs, get chest x-ray, and reassess shortly.  Doubt need for flu testing as she is outside of the Tamiflu window. Will reassess shortly.   6:16 PM CXR negative. Pt feeling a little better, tolerating PO well, and lung sounds slightly improved but still wheezy in lower fields. Will repeat nebs and await U/A.   7:53 PM U/A without evidence of UTI. Upreg neg. Pt vomited so gave phenergan and that has helped, she is now able to tolerate PO again. Pt feeling better and lung sounds improved after repeat nebs. Suspect viral illness, possibly flu, but pt outside of tamiflu window. Will send home with inhaler and zofran and phenergan, advised OTC remedies for symptomatic relief, and f/up with PCP in 1wk for recheck. I explained the diagnosis and have given explicit precautions to return to the ER including for any other new or worsening symptoms. The patient understands and accepts the medical plan as it's been dictated and I have answered their questions. Discharge instructions concerning home care and prescriptions have  been given. The patient is STABLE and is discharged to home in good condition.    Final Clinical Impressions(s) / ED Diagnoses   Final diagnoses:  Flu-like symptoms  Fatigue, unspecified type  Nausea and vomiting in adult patient    ED Discharge Orders  Ordered    ondansetron (ZOFRAN ODT) 4 MG disintegrating tablet  Every 8 hours PRN     08/30/18 1940    promethazine (PHENERGAN) 25 MG tablet  Every 6 hours PRN     08/30/18 1940    albuterol (PROVENTIL HFA;VENTOLIN HFA) 108 (90 Base) MCG/ACT inhaler  Every 4 hours PRN     08/30/18 [address] [place], [place] 08/30/18 1953    Mancel Bale, MD 08/30/18 [phone]

## 2018-08-30 NOTE — ED Triage Notes (Signed)
Pt reports Tuesday started having chills and not feeling well. Thursday started vomiting after eating Chili from [facility]. Reports fatigue. Denies bowel or urinary problems.

## 2018-08-30 NOTE — ED Notes (Signed)
Patient transported to X-ray 

## 2018-08-30 NOTE — ED Notes (Signed)
Patient given water

## 2018-08-30 NOTE — Discharge Instructions (Addendum)
Continue to stay well-hydrated. Use zofran and/or phenergan as directed as needed for nausea. Continue to alternate between Tylenol and Ibuprofen for pain or fever. Use Mucinex/Robitussin/etc for cough suppression/expectoration of mucus. Use over the counter flonase and the netipot to help with nasal congestion. May consider over-the-counter Benadryl or other antihistamine like Claritin/Zyrtec/etc to decrease secretions and for help with your symptoms. Use inhaler as directed, as needed for cough/chest congestion/wheezing/shortness of breath. Follow up with your primary care doctor in 5-7 days for recheck of ongoing symptoms. Return to emergency department for emergent changing or worsening of symptoms.

## 2018-08-31 ENCOUNTER — Emergency Department (HOSPITAL_COMMUNITY)
Admission: EM | Admit: 2018-08-31 | Discharge: 2018-09-01 | Disposition: A | Discharge status: Home / Self Care | Payer: Self-pay | Attending: Emergency Medicine | Admitting: Emergency Medicine

## 2018-08-31 ENCOUNTER — Ambulatory Visit
Admission: EM | Admit: 2018-08-31 | Discharge: 2018-08-31 | Disposition: A | Discharge status: Home / Self Care | Payer: Self-pay

## 2018-08-31 ENCOUNTER — Other Ambulatory Visit: Payer: Self-pay

## 2018-08-31 ENCOUNTER — Encounter (HOSPITAL_COMMUNITY): Payer: Self-pay

## 2018-08-31 DIAGNOSIS — Z79899 Other long term (current) drug therapy: Secondary | ICD-10-CM | POA: Insufficient documentation

## 2018-08-31 DIAGNOSIS — Z7982 Long term (current) use of aspirin: Secondary | ICD-10-CM | POA: Insufficient documentation

## 2018-08-31 DIAGNOSIS — I10 Essential (primary) hypertension: Secondary | ICD-10-CM | POA: Insufficient documentation

## 2018-08-31 DIAGNOSIS — R112 Nausea with vomiting, unspecified: Secondary | ICD-10-CM | POA: Insufficient documentation

## 2018-08-31 MED ORDER — SODIUM CHLORIDE 0.9 % IV BOLUS
1000.0000 mL | Freq: Once | INTRAVENOUS | Status: AC
  Administered 2018-08-31: 1000 mL via INTRAVENOUS

## 2018-08-31 MED ORDER — FAMOTIDINE 20 MG PO TABS
20.0000 mg | ORAL_TABLET | Freq: Once | ORAL | Status: AC
  Administered 2018-08-31: 20 mg via ORAL
  Filled 2018-08-31: qty 1

## 2018-08-31 MED ORDER — ONDANSETRON HCL 4 MG/2ML IJ SOLN
4.0000 mg | Freq: Once | INTRAMUSCULAR | Status: AC
  Administered 2018-08-31: 4 mg via INTRAVENOUS
  Filled 2018-08-31: qty 2

## 2018-08-31 NOTE — ED Provider Notes (Signed)
[facility]-EMERGENCY DEPT Provider Note   CSN: [id number] Arrival date & time: 08/31/18  1821    History   Chief Complaint Chief Complaint  Patient presents with  . Emesis    HPI Grace [name] is a 53 y.o. female w PMHx HTN, presenting to the ED with second visit regarding nausea and vomiting that began thursday. Pt was evaluated yesterday for the same with associated upper respiratory symptoms, diagnosed with flu-like symptoms.  Patient states her symptoms are not changing or worsening.  She presents with persistent nausea and vomiting, unable to control with the prescription of Phenergan she was provided yesterday. She did not take the zofran she was prescribed. She feels fatigued and dehydrated. She denies any assoc abdominal pain, states she feels "queasy." She does however report her respiratory symptoms are much improved. Her labs yesterday were reassuring, no leukocytosis. Normal renal and hepatic function.     The history is provided by the patient.    Past Medical History:  Diagnosis Date  . Hypertension     Patient Active Problem List   Diagnosis Date Noted  . Tinea pedis 07/22/2017  . Hypertension 03/25/2016    History reviewed. No pertinent surgical history.   OB History   No obstetric history on file.      Home Medications    Prior to Admission medications   Medication Sig Start Date End Date Taking? Authorizing Provider  albuterol (PROVENTIL HFA;VENTOLIN HFA) 108 (90 Base) MCG/ACT inhaler Inhale 1-2 puffs into the lungs every 4 (four) hours as needed for wheezing or shortness of breath (or cough). 08/30/18  Yes Street, [place], PA-C  amLODipine (NORVASC) 10 MG tablet Take 1 tablet (10 mg total) by mouth daily. 02/25/18  Yes Anders Simmonds, PA-C  aspirin EC 81 MG tablet Take 1 every other day Patient taking differently: Take 81 mg by mouth every other day. Take 1 every other day 02/25/18  Yes Anders Simmonds, PA-C    aspirin-acetaminophen-caffeine (EXCEDRIN MIGRAINE) [phone] MG tablet Take 1 tablet by mouth every 6 (six) hours as needed for headache.   Yes [provider]  ondansetron (ZOFRAN ODT) 4 MG disintegrating tablet Take 1 tablet (4 mg total) by mouth every 8 (eight) hours as needed for nausea or vomiting. 08/30/18  Yes Street, [place], PA-C  promethazine (PHENERGAN) 25 MG tablet Take 1 tablet (25 mg total) by mouth every 6 (six) hours as needed for nausea or vomiting. 08/30/18  Yes Street, [place], PA-C  nystatin-triamcinolone (MYCOLOG II) cream Apply 1 application topically 2 (two) times daily. Patient not taking: Reported on 02/25/2018 07/29/17   Hoy Register, MD    Family History No family history on file.  Social History Social History   Tobacco Use  . Smoking status: Never Smoker  . Smokeless tobacco: Never Used  Substance Use Topics  . Alcohol use: Yes    Comment: occasionally  . Drug use: No     Allergies   Patient has no known allergies.   Review of Systems Review of Systems  Constitutional: Negative for fever.  Respiratory: Negative for shortness of breath.   Gastrointestinal: Positive for nausea and vomiting. Negative for abdominal pain, constipation and diarrhea.  Genitourinary: Negative for dysuria and frequency.  All other systems reviewed and are negative.    Physical Exam Updated Vital Signs BP (!) 140/101   Pulse 86   Temp 98.5 F (36.9 C) (Oral)   Resp 17   Ht 5\' 2"  (1.575 m)  Wt 69.4 kg   SpO2 99%   BMI 27.98 kg/m   Physical Exam Vitals signs and nursing note reviewed.  Constitutional:      General: She is not in acute distress.    Appearance: She is well-developed. She is not ill-appearing.  HENT:     Head: Normocephalic and atraumatic.     Mouth/Throat:     Mouth: Mucous membranes are moist.     Pharynx: Oropharynx is clear.  Eyes:     Conjunctiva/sclera: Conjunctivae normal.  Neck:     Musculoskeletal: Normal range of  motion and neck supple.  Cardiovascular:     Rate and Rhythm: Regular rhythm. Tachycardia present.  Pulmonary:     Effort: Pulmonary effort is normal. No respiratory distress.     Breath sounds: Normal breath sounds.  Abdominal:     General: Bowel sounds are normal. There is no distension.     Palpations: Abdomen is soft. There is no mass.     Tenderness: There is no abdominal tenderness. There is no guarding or rebound.  Skin:    General: Skin is warm.  Neurological:     Mental Status: She is alert.  Psychiatric:        Behavior: Behavior normal.      ED Treatments / Results  Labs (all labs ordered are listed, but only abnormal results are displayed) Labs Reviewed - No data to display  EKG None  Radiology Dg Chest 2 View  Result Date: 08/30/2018 CLINICAL DATA:  Cough EXAM: CHEST - 2 VIEW COMPARISON:  None. FINDINGS: Normal heart size. Normal mediastinal contour. No pneumothorax. No pleural effusion. Lungs appear clear, with no acute consolidative airspace disease and no pulmonary edema. IMPRESSION: No active cardiopulmonary disease. Electronically Signed   By: Delbert Phenix M.D.   On: 08/30/2018 17:48    Procedures Procedures (including critical care time)  Medications Ordered in ED Medications  sodium chloride 0.9 % bolus 1,000 mL (1,000 mLs Intravenous New Bag/Given 08/31/18 2151)  ondansetron (ZOFRAN) injection 4 mg (4 mg Intravenous Given 08/31/18 2150)  famotidine (PEPCID) tablet 20 mg (20 mg Oral Given 08/31/18 2320)     Initial Impression / Assessment and Plan / ED Course  I have reviewed the triage vital signs and the nursing notes.  Pertinent labs & imaging results that were available during my care of the patient were reviewed by me and considered in my medical decision making (see chart for details).   Pt presenting with persistent N/V, not improved with prescribed PO phenergan. No assoc abd pain. No fever. URI sx are improved. On exam, afebrile, slightly  tachycardic. BP stable. Abdomen is benign. Do not believe we need repeat lab work today. Will administer IVF and antiemetics and reevaluate.  Clinical Course as of Feb 25 0002  Mon Aug 31, 2018  2245 Pt re-evaluated. Reports improvement in symptoms, however has some heartburn. HR normalized. Will PO challenge, given pepcid and finish IVF administration.   [JR]    Clinical Course User Index [JR] Khylee Algeo, [place] N, PA-C      Pt tolerating PO. Pt agreeable to discharge. Recommend she fill rx for ODT zofran as provided yesterday. pepcid bid prn for reflux. Slowly advance diet as tolerated. PCP follow up.   Discussed results, findings, treatment and follow up. Patient advised of return precautions. Patient verbalized understanding and agreed with plan.  Final Clinical Impressions(s) / ED Diagnoses   Final diagnoses:  Non-intractable vomiting with nausea, unspecified vomiting type  ED Discharge Orders    None       Devontre Siedschlag, [place] N, PA-C 09/01/18 0003    Mancel Bale, MD 09/01/18 [place]

## 2018-08-31 NOTE — ED Triage Notes (Signed)
Pt states she has thrown up "several" times today. Pt states no relief with home meds.

## 2018-08-31 NOTE — ED Notes (Addendum)
Pt is alert and oriented x 4 and is verbally responsive. Pt reports that she has vomitied 6-7 times today and was seen here at [facility] for similar complaint yesterday. Pt denies and abdominal pain, but is moaning and states that she is not having pain, and can not describe her discomfort. Pt denies any pain or burning with urination, diarrhea, or fevers at home.

## 2018-08-31 NOTE — ED Notes (Signed)
Patient provided with ginger ale for PO challenge.

## 2018-08-31 NOTE — Discharge Instructions (Addendum)
Please read instructions below. Drink clear liquids until your stomach feels better. Then, slowly introduce bland foods into your diet as tolerated, such as bread, rice, apples, bananas. You can take zofran every 8 hours as needed for nausea. This dissolves on your tongue. You can take pepcid every 12 hours for acid reflux/heart burn. Follow up with your primary care regarding your recent visits. Return to the ER for severe abdominal pain, fever, uncontrollable vomiting, or new or concerning symptoms.

## 2018-08-31 NOTE — ED Notes (Signed)
Spoke to patient and family members about patient's current symptoms.  Patient too weak to walk in by herself, states she is still having episodes of emesis, even with home meds.  Patient somnolent during interview.  Spoke to Alder, NP and we recommended patient follow up in ER.  This RN offered an ambulance, patient/family refused.  Patient assisted back to car in wheelchair and family states they will follow up with ER.

## 2018-10-28 ENCOUNTER — Other Ambulatory Visit: Payer: Self-pay

## 2018-10-28 ENCOUNTER — Ambulatory Visit: Payer: Self-pay | Attending: Family Medicine | Admitting: Family Medicine

## 2018-10-28 ENCOUNTER — Encounter: Payer: Self-pay | Admitting: Family Medicine

## 2018-10-28 DIAGNOSIS — M79602 Pain in left arm: Secondary | ICD-10-CM

## 2018-10-28 DIAGNOSIS — I10 Essential (primary) hypertension: Secondary | ICD-10-CM

## 2018-10-28 MED ORDER — AMLODIPINE BESYLATE 10 MG PO TABS: 10.0000 mg | ORAL_TABLET | Freq: Every day | ORAL | 1 refills | Status: DC

## 2018-10-28 MED ORDER — CYCLOBENZAPRINE HCL 10 MG PO TABS: 10.0000 mg | ORAL_TABLET | Freq: Two times a day (BID) | ORAL | 0 refills | Status: DC | PRN

## 2018-10-28 NOTE — Progress Notes (Signed)
Patient has been called and DOB has been verified. Patient has been screened and transferred to PCP to start phone visit.  C/C: Pain under left arm

## 2018-10-28 NOTE — Progress Notes (Signed)
Virtual Visit via Telephone Note  I connected with Mliss SaxDonna M [name], on 10/28/2018 at 11:33 AM by telephone and verified that I am speaking with the correct person using two identifiers.   Consent: I discussed the limitations, risks, security and privacy concerns of performing an evaluation and management service by telephone and the availability of in person appointments. I also discussed with the patient that there may be a patient responsible charge related to this service. The patient expressed understanding and agreed to proceed.   Location of Patient: Patient's home  Location of Provider: Clinic   Persons participating in Telemedicine visit: Marsa ArisDonna M Trick  Alicia Farrington-CMA Dr. Nelwyn SalisburyNewlin-PCP     History of Present Illness: Grace CowboyDonna [name] is a 53 year old female with a history of hypertension who is seen today for an office visit. Complains of a 5-day history of left shoulder blade pain which radiates to under her left arm and around to her left inframammary region as a 6.5/10. Denies shoulder pain, tingling, no change in hand grip.  She endorses moving some furniture but no heavy lifting recently.  Ibuprofen provides some relief but symptoms resolved once her medication has worn off. Her last mammogram was in 2015 and was negative for malignancy. She has no additional concerns today.   Past Medical History:  Diagnosis Date  . Hypertension    No Known Allergies  Current Outpatient Medications on File Prior to Visit  Medication Sig Dispense Refill  . albuterol (PROVENTIL HFA;VENTOLIN HFA) 108 (90 Base) MCG/ACT inhaler Inhale 1-2 puffs into the lungs every 4 (four) hours as needed for wheezing or shortness of breath (or cough). 1 Inhaler 0  . amLODipine (NORVASC) 10 MG tablet Take 1 tablet (10 mg total) by mouth daily. 90 tablet 5  . aspirin EC 81 MG tablet Take 1 every other day (Patient taking differently: Take 81 mg by mouth every other day. Take 1 every other day) 90 tablet 1  .  aspirin-acetaminophen-caffeine (EXCEDRIN MIGRAINE) 250-250-65 MG tablet Take 1 tablet by mouth every 6 (six) hours as needed for headache.    . nystatin-triamcinolone (MYCOLOG II) cream Apply 1 application topically 2 (two) times daily. (Patient not taking: Reported on 02/25/2018) 30 g 6  . ondansetron (ZOFRAN ODT) 4 MG disintegrating tablet Take 1 tablet (4 mg total) by mouth every 8 (eight) hours as needed for nausea or vomiting. (Patient not taking: Reported on 10/28/2018) 15 tablet 0  . promethazine (PHENERGAN) 25 MG tablet Take 1 tablet (25 mg total) by mouth every 6 (six) hours as needed for nausea or vomiting. (Patient not taking: Reported on 10/28/2018) 10 tablet 0   No current facility-administered medications on file prior to visit.     Observations/Objective: Awake, alert, oriented x3 Not in acute distress  Assessment and Plan: 1. Essential hypertension Advised to keep a log of blood pressure at home Blood pressure at last visit was controlled - amLODipine (NORVASC) 10 MG tablet; Take 1 tablet (10 mg total) by mouth daily.  Dispense: 90 tablet; Refill: 1  2. Pain of left upper extremity Musculoskeletal Apply heat, discussed stretching exercises She will use OTC ibuprofen which she has at home - cyclobenzaprine (FLEXERIL) 10 MG tablet; Take 1 tablet (10 mg total) by mouth 2 (two) times daily as needed for muscle spasms.  Dispense: 30 tablet; Refill: 0   She is due for a screening mammogram but at this time elective imaging studies have been postponed due to ongoing pandemic.  Discussed with her that at  her next in person visit that will be top priority  Follow Up Instructions: 3 months   I discussed the assessment and treatment plan with the patient. The patient was provided an opportunity to ask questions and all were answered. The patient agreed with the plan and demonstrated an understanding of the instructions.   The patient was advised to call back or seek an in-person  evaluation if the symptoms worsen or if the condition fails to improve as anticipated.     I provided 11 minutes total of non-face-to-face time during this encounter including median intraservice time, reviewing previous notes, labs, imaging, medications and explaining diagnosis and management.     Hoy Register, MD, FAAFP. [facility]ness [place], [place] [phone]   10/28/2018, 11:33 AM

## 2018-11-07 ENCOUNTER — Other Ambulatory Visit: Payer: Self-pay | Admitting: Family Medicine

## 2018-11-07 DIAGNOSIS — M79602 Pain in left arm: Secondary | ICD-10-CM

## 2019-06-23 ENCOUNTER — Other Ambulatory Visit: Payer: Self-pay | Admitting: Family Medicine

## 2019-06-23 DIAGNOSIS — I10 Essential (primary) hypertension: Secondary | ICD-10-CM

## 2019-09-27 ENCOUNTER — Other Ambulatory Visit: Payer: Self-pay | Admitting: Family Medicine

## 2019-09-27 DIAGNOSIS — I10 Essential (primary) hypertension: Secondary | ICD-10-CM

## 2019-12-01 ENCOUNTER — Ambulatory Visit: Payer: Self-pay | Attending: Family Medicine | Admitting: Physician Assistant

## 2019-12-01 ENCOUNTER — Other Ambulatory Visit: Payer: Self-pay

## 2019-12-01 DIAGNOSIS — I10 Essential (primary) hypertension: Secondary | ICD-10-CM

## 2019-12-01 DIAGNOSIS — J9801 Acute bronchospasm: Secondary | ICD-10-CM

## 2019-12-01 DIAGNOSIS — Z131 Encounter for screening for diabetes mellitus: Secondary | ICD-10-CM

## 2019-12-01 DIAGNOSIS — Z13220 Encounter for screening for lipoid disorders: Secondary | ICD-10-CM

## 2019-12-01 MED ORDER — ASPIRIN EC 81 MG PO TBEC: 81.0000 mg | DELAYED_RELEASE_TABLET | ORAL | 1 refills | Status: DC

## 2019-12-01 MED ORDER — ALBUTEROL SULFATE HFA 108 (90 BASE) MCG/ACT IN AERS: 1.0000 | INHALATION_SPRAY | RESPIRATORY_TRACT | 1 refills | Status: AC | PRN

## 2019-12-01 MED ORDER — AMLODIPINE BESYLATE 10 MG PO TABS: 10.0000 mg | ORAL_TABLET | Freq: Every day | ORAL | 1 refills | Status: DC

## 2019-12-01 NOTE — Progress Notes (Signed)
Virtual Visit via Telephone Note  I connected with Grace [name] on 12/01/19 at  4:10 PM EDT by telephone and verified that I am speaking with the correct person using two identifiers.   I discussed the limitations, risks, security and privacy concerns of performing an evaluation and management service by telephone and the availability of in person appointments. I also discussed with the patient that there may be a patient responsible charge related to this service. The patient expressed understanding and agreed to proceed.  PATIENT visit by telephone virtually in the context of Covid-19 pandemic. Patient location:  home My Location:  [facility] office Persons on the call:  Me and the patient   History of Present Illness:  Patient needs RF of meds.  Denies CP/dizziness/HA.  BP OOO ~120-130/80s.  Occasional but rare wheezing.      Observations/Objective: NAD.  A&Ox3  Assessment and Plan: 1. Essential hypertension Continue current regimen/controlled based on OOO readings - amLODipine (NORVASC) 10 MG tablet; Take 1 tablet (10 mg total) by mouth daily.  Dispense: 90 tablet; Refill: 1 - aspirin EC 81 MG tablet; Take 1 tablet (81 mg total) by mouth every other day. Take 1 every other day  Dispense: 100 tablet; Refill: 1 - Comprehensive metabolic panel; Future - CBC with Differential/Platelet; Future  2. Bronchospasm For occasional use - albuterol (VENTOLIN HFA) 108 (90 Base) MCG/ACT inhaler; Inhale 1-2 puffs into the lungs every 4 (four) hours as needed for wheezing or shortness of breath (or cough).  Dispense: 18 g; Refill: 1  3. Screening for diabetes mellitus - Hemoglobin A1c; Future  4. Screening cholesterol level - Lipid panel; Future   Follow Up Instructions: See PCP in 3 months   I discussed the assessment and treatment plan with the patient. The patient was provided an opportunity to ask questions and all were answered. The patient agreed with the plan and demonstrated an  understanding of the instructions.   The patient was advised to call back or seek an in-person evaluation if the symptoms worsen or if the condition fails to improve as anticipated.  I provided 9  minutes of non-face-to-face time during this encounter.   Georgian Co, PA-C  Patient ID: Grace [name], female   DOB: 08/27/1965, 54 y.o.   MRN: [id number]

## 2019-12-08 ENCOUNTER — Ambulatory Visit: Payer: Self-pay | Attending: Family Medicine

## 2019-12-08 ENCOUNTER — Other Ambulatory Visit: Payer: Self-pay

## 2019-12-08 DIAGNOSIS — Z13220 Encounter for screening for lipoid disorders: Secondary | ICD-10-CM

## 2019-12-08 DIAGNOSIS — Z131 Encounter for screening for diabetes mellitus: Secondary | ICD-10-CM

## 2019-12-08 DIAGNOSIS — I10 Essential (primary) hypertension: Secondary | ICD-10-CM

## 2019-12-09 ENCOUNTER — Other Ambulatory Visit: Payer: Self-pay | Admitting: Physician Assistant

## 2019-12-09 DIAGNOSIS — E785 Hyperlipidemia, unspecified: Secondary | ICD-10-CM

## 2019-12-09 DIAGNOSIS — E1165 Type 2 diabetes mellitus with hyperglycemia: Secondary | ICD-10-CM

## 2019-12-09 LAB — COMPREHENSIVE METABOLIC PANEL
ALT: 8 IU/L (ref 0–32)
AST: 19 IU/L (ref 0–40)
Albumin/Globulin Ratio: 1.5 (ref 1.2–2.2)
Albumin: 4.8 g/dL (ref 3.8–4.9)
Alkaline Phosphatase: 69 IU/L (ref 48–121)
BUN/Creatinine Ratio: 10 (ref 9–23)
BUN: 9 mg/dL (ref 6–24)
Bilirubin Total: 0.3 mg/dL (ref 0.0–1.2)
CO2: 24 mmol/L (ref 20–29)
Calcium: 10.1 mg/dL (ref 8.7–10.2)
Chloride: 104 mmol/L (ref 96–106)
Creatinine, Ser: 0.87 mg/dL (ref 0.57–1.00)
GFR calc Af Amer: 87 mL/min/{1.73_m2} (ref >59)
GFR calc non Af Amer: 76 mL/min/{1.73_m2} (ref >59)
Globulin, Total: 3.1 g/dL (ref 1.5–4.5)
Glucose: 91 mg/dL (ref 65–99)
Potassium: 4.5 mmol/L (ref 3.5–5.2)
Sodium: 142 mmol/L (ref 134–144)
Total Protein: 7.9 g/dL (ref 6.0–8.5)

## 2019-12-09 LAB — CBC WITH DIFFERENTIAL/PLATELET
Basophils Absolute: 0 10*3/uL (ref 0.0–0.2)
Basos: 0 %
EOS (ABSOLUTE): 0.1 10*3/uL (ref 0.0–0.4)
Eos: 2 %
Hematocrit: 41.6 % (ref 34.0–46.6)
Hemoglobin: 13.4 g/dL (ref 11.1–15.9)
Immature Grans (Abs): 0 10*3/uL (ref 0.0–0.1)
Immature Granulocytes: 0 %
Lymphocytes Absolute: 1.9 10*3/uL (ref 0.7–3.1)
Lymphs: 45 %
MCH: 28.8 pg (ref 26.6–33.0)
MCHC: 32.2 g/dL (ref 31.5–35.7)
MCV: 89 fL (ref 79–97)
Monocytes Absolute: 0.3 10*3/uL (ref 0.1–0.9)
Monocytes: 7 %
Neutrophils Absolute: 2 10*3/uL (ref 1.4–7.0)
Neutrophils: 46 %
Platelets: 363 10*3/uL (ref 150–450)
RBC: 4.66 x10E6/uL (ref 3.77–5.28)
RDW: 13.1 % (ref 11.7–15.4)
WBC: 4.3 10*3/uL (ref 3.4–10.8)

## 2019-12-09 LAB — LIPID PANEL
Chol/HDL Ratio: 4.7 ratio — ABNORMAL HIGH (ref 0.0–4.4)
Cholesterol, Total: 221 mg/dL — ABNORMAL HIGH (ref 100–199)
HDL: 47 mg/dL (ref >39)
LDL Chol Calc (NIH): 146 mg/dL — ABNORMAL HIGH (ref 0–99)
Triglycerides: 154 mg/dL — ABNORMAL HIGH (ref 0–149)
VLDL Cholesterol Cal: 28 mg/dL (ref 5–40)

## 2019-12-09 LAB — HEMOGLOBIN A1C
Est. average glucose Bld gHb Est-mCnc: 128 mg/dL
Hgb A1c MFr Bld: 6.1 % — ABNORMAL HIGH (ref 4.8–5.6)

## 2019-12-09 MED ORDER — METFORMIN HCL 500 MG PO TABS: 500.0000 mg | ORAL_TABLET | Freq: Two times a day (BID) | ORAL | 3 refills | Status: DC

## 2019-12-09 MED ORDER — ATORVASTATIN CALCIUM 20 MG PO TABS: 20.0000 mg | ORAL_TABLET | Freq: Every day | ORAL | 3 refills | Status: DC

## 2020-01-29 IMAGING — CR DG CHEST 2V
2 series · 2 of 2 positions shown · non-contrast
Comparison: None.

CLINICAL DATA: Cough

EXAM:
CHEST - 2 VIEW

[w chest pa]
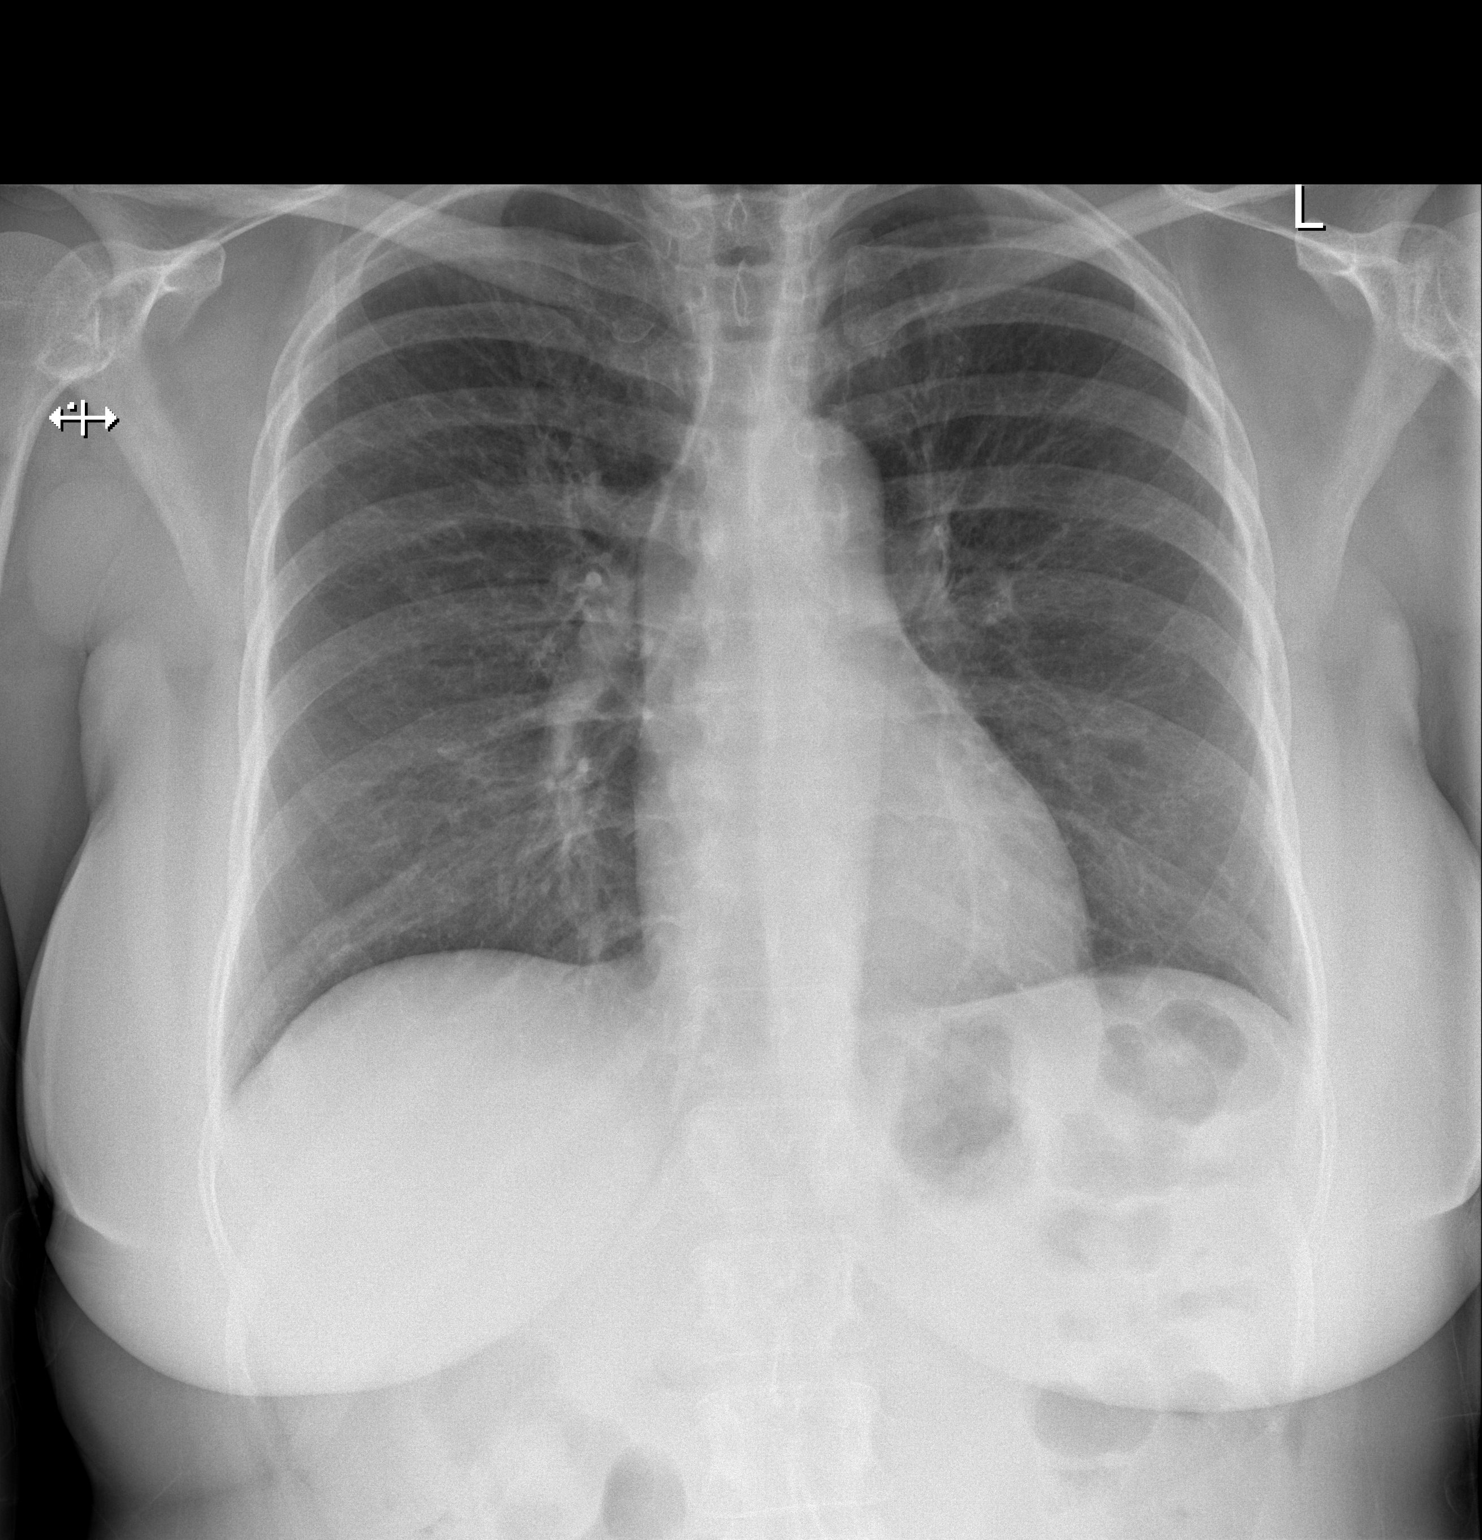

[w chest lat]
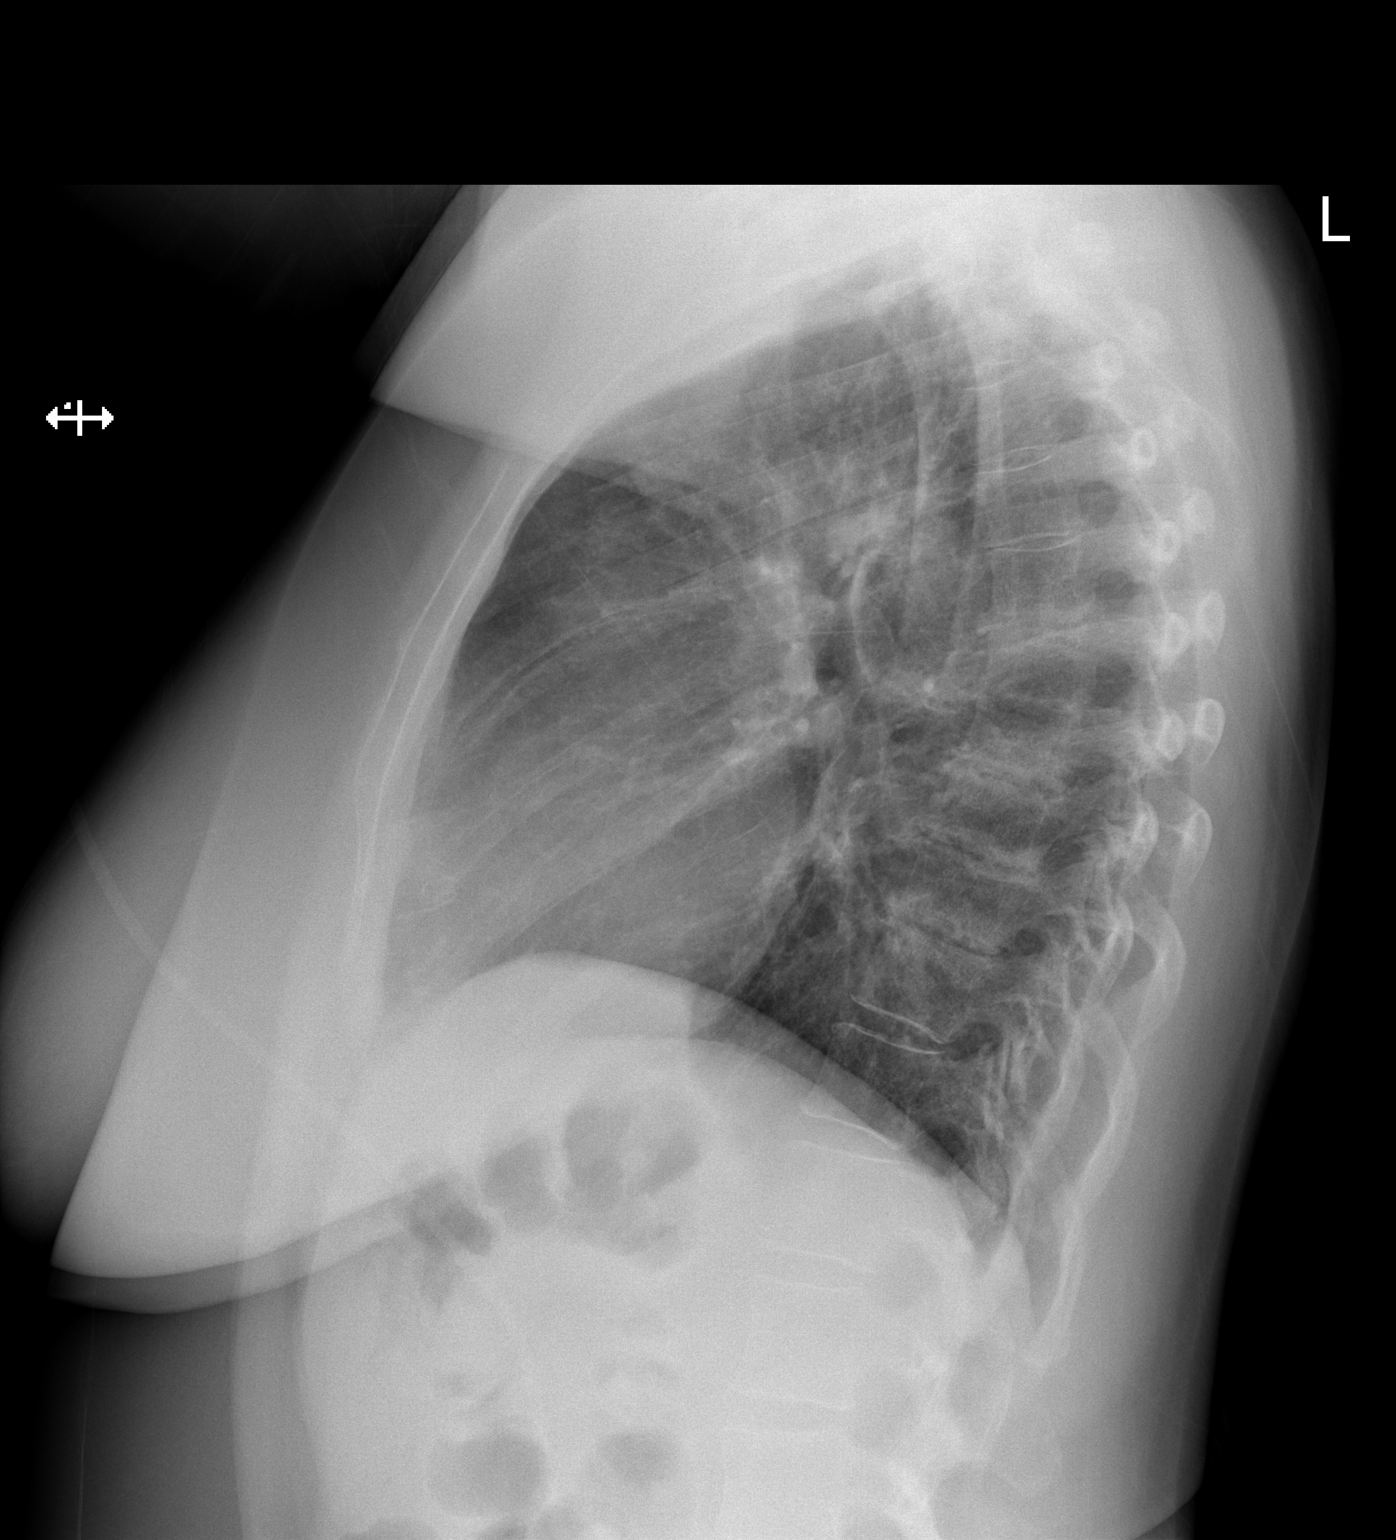

[2 of 2 positions shown; findings below may reference images not displayed]

FINDINGS: Normal heart size. Normal mediastinal contour. No pneumothorax. No
pleural effusion. Lungs appear clear, with no acute consolidative
airspace disease and no pulmonary edema.
IMPRESSION: No active cardiopulmonary disease.

## 2020-06-05 ENCOUNTER — Other Ambulatory Visit: Payer: Self-pay | Admitting: Physician Assistant

## 2020-06-05 DIAGNOSIS — I10 Essential (primary) hypertension: Secondary | ICD-10-CM

## 2020-06-05 NOTE — Telephone Encounter (Signed)
Requested Prescriptions  Pending Prescriptions Disp Refills  . amLODipine (NORVASC) 10 MG tablet [Pharmacy Med Name: AMLODIPINE BESYLATE 10 MG TAB] 30 tablet 0    Sig: TAKE 1 TABLET BY MOUTH EVERY DAY     Cardiovascular:  Calcium Channel Blockers Failed - 06/05/2020  1:29 AM      Failed - Last BP in normal range    BP Readings from Last 1 Encounters:  08/31/18 (!) 150/94         Failed - Valid encounter within last 6 months    Recent Outpatient Visits          6 months ago Bronchospasm   [facility] [place], [place], [place]   1 year ago Pain of left upper extremity   [facility] [place], Odette Horns, [place]   2 years ago Rash and nonspecific skin eruption   [facility] [place], [place], [place]   2 years ago Essential hypertension   [facility] [place], Odette Horns, MD   3 years ago Tinea pedis of both feet   [facility] [place], Odette Horns, MD             Called and left VM to call for OV . 30 day courtesy refill given.

## 2020-07-01 ENCOUNTER — Other Ambulatory Visit: Payer: Self-pay | Admitting: Family Medicine

## 2020-07-01 DIAGNOSIS — I10 Essential (primary) hypertension: Secondary | ICD-10-CM

## 2020-07-03 NOTE — Telephone Encounter (Signed)
Requested medication (s) are due for refill today:   Yes  Requested medication (s) are on the active medication list:   Yes  Future visit scheduled:   No  Has not called in for return appt and 30 day courtesy refill was given in Nov.   Last ordered: 06/05/2020 #30, 0 refills  Provider to decide on future refills since courtesy refill has been given.   Requested Prescriptions  Pending Prescriptions Disp Refills   amLODipine (NORVASC) 10 MG tablet [Pharmacy Med Name: AMLODIPINE BESYLATE 10 MG TAB] 30 tablet 0    Sig: TAKE 1 TABLET BY MOUTH EVERY DAY      Cardiovascular:  Calcium Channel Blockers Failed - 07/01/2020  9:30 AM      Failed - Last BP in normal range    BP Readings from Last 1 Encounters:  08/31/18 (!) 150/94          Failed - Valid encounter within last 6 months    Recent Outpatient Visits           7 months ago Bronchospasm   [facility] [place], [place], [place]   1 year ago Pain of left upper extremity   [facility] [place], Odette Horns, MD   2 years ago Rash and nonspecific skin eruption   [facility] [place], [place], [place]   2 years ago Essential hypertension   [facility] [place], Odette Horns, MD   3 years ago Tinea pedis of both feet   [facility] Hoy Register, MD

## 2020-07-15 ENCOUNTER — Other Ambulatory Visit: Payer: Self-pay | Admitting: Family Medicine

## 2020-07-15 DIAGNOSIS — I10 Essential (primary) hypertension: Secondary | ICD-10-CM

## 2020-07-16 NOTE — Telephone Encounter (Signed)
Requested medication (s) are due for refill today: yes  Requested medication (s) are on the active medication list: yes  Last refill:  06/05/20 courtesy refill  Future visit scheduled: no  Notes to clinic:  needs appt- called pt and no answer- no other number in chart   Requested Prescriptions  Pending Prescriptions Disp Refills   amLODipine (NORVASC) 10 MG tablet [Pharmacy Med Name: AMLODIPINE BESYLATE 10 MG TAB] 30 tablet 0    Sig: TAKE 1 TABLET BY MOUTH EVERY DAY      Cardiovascular:  Calcium Channel Blockers Failed - 07/15/2020  4:36 PM      Failed - Last BP in normal range    BP Readings from Last 1 Encounters:  08/31/18 (!) 150/94          Failed - Valid encounter within last 6 months    Recent Outpatient Visits           7 months ago Bronchospasm   [facility] [place], [place], [place]   1 year ago Pain of left upper extremity   [facility] [place], Odette Horns, MD   2 years ago Rash and nonspecific skin eruption   [facility] [place], [place], [place]   2 years ago Essential hypertension   [facility] [place], Odette Horns, MD   3 years ago Tinea pedis of both feet   [facility] Hoy Register, MD

## 2020-07-18 NOTE — Telephone Encounter (Signed)
Pt called saying she needs a refill asap.  She made an appt but can not be seen until March.  She verified that her number is [phone]

## 2020-08-09 ENCOUNTER — Other Ambulatory Visit: Payer: Self-pay | Admitting: Family Medicine

## 2020-08-09 DIAGNOSIS — I10 Essential (primary) hypertension: Secondary | ICD-10-CM

## 2020-08-09 NOTE — Telephone Encounter (Signed)
Courtesy refill. Future visit in 4 weeks  

## 2020-08-20 ENCOUNTER — Other Ambulatory Visit: Payer: Self-pay

## 2020-08-20 ENCOUNTER — Ambulatory Visit
Admission: EM | Admit: 2020-08-20 | Discharge: 2020-08-20 | Disposition: A | Discharge status: Home / Self Care | Payer: BC Managed Care – PPO | Attending: Family Medicine | Admitting: Family Medicine

## 2020-08-20 DIAGNOSIS — R21 Rash and other nonspecific skin eruption: Secondary | ICD-10-CM

## 2020-08-20 MED ORDER — PREDNISONE 10 MG (21) PO TBPK: ORAL_TABLET | ORAL | 0 refills | Status: DC

## 2020-08-20 MED ORDER — TRIAMCINOLONE ACETONIDE 0.1 % EX CREA: 1.0000 "application " | TOPICAL_CREAM | Freq: Two times a day (BID) | CUTANEOUS | 0 refills | Status: DC

## 2020-08-20 NOTE — ED Triage Notes (Signed)
Patient states she has had a rash x 2 weeks that consists of red areas that appear and last for several days and resolved. She has had these on all parts of her body. Pt is aox4 and ambulatory.

## 2020-08-20 NOTE — ED Provider Notes (Signed)
[facility]    CSN: [id number] Arrival date & time: 08/20/20  6767      History   Chief Complaint Chief Complaint  Patient presents with  . Rash    X 2 weeks    HPI Grace [name] is a 55 y.o. female.   Here today with an intermittent rash appearing sporadically across body x 2 weeks now, mostly resolved at current moment. In some areas appearing as large itchy welts, other areas red bumps. Currently one patch on right upper arm and several isolated bumps on legs. Has not had any new exposures including hygiene products, foods, medications and has not tried anything OTC for sxs. No known hx of similar rashes. Denies associated body aches, fever, chills, tick bites, breathing difficulties, or other generalized sxs.      Past Medical History:  Diagnosis Date  . Hypertension     Patient Active Problem List   Diagnosis Date Noted  . Tinea pedis 07/22/2017  . Hypertension 03/25/2016    History reviewed. No pertinent surgical history.  OB History   No obstetric history on file.      Home Medications    Prior to Admission medications   Medication Sig Start Date End Date Taking? Authorizing Provider  albuterol (VENTOLIN HFA) 108 (90 Base) MCG/ACT inhaler Inhale 1-2 puffs into the lungs every 4 (four) hours as needed for wheezing or shortness of breath (or cough). 12/01/19  Yes McClung, Angela M, PA-C  amLODipine (NORVASC) 10 MG tablet TAKE 1 TABLET BY MOUTH EVERY DAY 08/09/20  Yes Hoy Register, MD  aspirin EC 81 MG tablet Take 1 tablet (81 mg total) by mouth every other day. Take 1 every other day 12/01/19  Yes Anders Simmonds, PA-C  aspirin-acetaminophen-caffeine (EXCEDRIN MIGRAINE) [phone] MG tablet Take 1 tablet by mouth every 6 (six) hours as needed for headache.   Yes [provider]  metFORMIN (GLUCOPHAGE) 500 MG tablet Take 1 tablet (500 mg total) by mouth 2 (two) times daily with a meal. 12/09/19  Yes McClung, Angela M, PA-C  predniSONE  (STERAPRED UNI-PAK 21 TAB) 10 MG (21) TBPK tablet Take 6 tabs day one, 5 tabs day two, 4 tabs day three, etc 08/20/20  Yes Particia Nearing, PA-C  triamcinolone (KENALOG) 0.1 % Apply 1 application topically 2 (two) times daily. 08/20/20  Yes Particia Nearing, PA-C  atorvastatin (LIPITOR) 20 MG tablet Take 1 tablet (20 mg total) by mouth daily. 12/09/19   Anders Simmonds, PA-C  cyclobenzaprine (FLEXERIL) 10 MG tablet TAKE 1 TABLET BY MOUTH TWICE A DAY AS NEEDED FOR MUSCLE SPASMS Patient not taking: No sig reported 11/09/18   Hoy Register, MD  nystatin-triamcinolone (MYCOLOG II) cream Apply 1 application topically 2 (two) times daily. Patient not taking: No sig reported 07/29/17   Hoy Register, MD  ondansetron (ZOFRAN ODT) 4 MG disintegrating tablet Take 1 tablet (4 mg total) by mouth every 8 (eight) hours as needed for nausea or vomiting. Patient not taking: No sig reported 08/30/18   [address], [place], [place]    Family History History reviewed. No pertinent family history.  Social History Social History   Tobacco Use  . Smoking status: Never Smoker  . Smokeless tobacco: Never Used  Vaping Use  . Vaping Use: Never used  Substance Use Topics  . Alcohol use: Yes    Comment: occasionally  . Drug use: No     Allergies   Patient has no known allergies.   Review of  Systems Review of Systems PER HPI    Physical Exam Triage Vital Signs ED Triage Vitals  Enc Vitals Group     BP 08/20/20 0942 (!) 154/89     Pulse Rate 08/20/20 0942 70     Resp 08/20/20 0942 18     Temp 08/20/20 0942 98.2 F (36.8 C)     Temp Source 08/20/20 0942 Oral     SpO2 08/20/20 0942 98 %     Weight --      Height --      Head Circumference --      Peak Flow --      Pain Score 08/20/20 0944 0     Pain Loc --      Pain Edu? --      Excl. in GC? --    No data found.  Updated Vital Signs BP (!) 154/89 (BP Location: Left Arm)   Pulse 70   Temp 98.2 F (36.8 C) (Oral)   Resp 18   SpO2  98%   Visual Acuity Right Eye Distance:   Left Eye Distance:   Bilateral Distance:    Right Eye Near:   Left Eye Near:    Bilateral Near:     Physical Exam Vitals and nursing note reviewed.  Constitutional:      Appearance: Normal appearance. She is not ill-appearing.  HENT:     Head: Atraumatic.  Eyes:     Extraocular Movements: Extraocular movements intact.     Conjunctiva/sclera: Conjunctivae normal.  Cardiovascular:     Rate and Rhythm: Normal rate and regular rhythm.     Heart sounds: Normal heart sounds.  Pulmonary:     Effort: Pulmonary effort is normal.     Breath sounds: Normal breath sounds.  Musculoskeletal:        General: Normal range of motion.     Cervical back: Normal range of motion and neck supple.  Skin:    General: Skin is warm and dry.     Findings: Rash (erythematous maculopapular area around 2.5 cm diameter right upper arm, no raised border, peeling or scaling in area, no central clearing. several scattered erythematous papules scattered across legs) present.  Neurological:     Mental Status: She is alert and oriented to person, place, and time.  Psychiatric:        Mood and Affect: Mood normal.        Thought Content: Thought content normal.        Judgment: Judgment normal.      UC Treatments / Results  Labs (all labs ordered are listed, but only abnormal results are displayed) Labs Reviewed - No data to display  EKG   Radiology No results found.  Procedures Procedures (including critical care time)  Medications Ordered in UC Medications - No data to display  Initial Impression / Assessment and Plan / UC Course  I have reviewed the triage vital signs and the nursing notes.  Pertinent labs & imaging results that were available during my care of the patient were reviewed by me and considered in my medical decision making (see chart for details).     Possibly hypersensitivity reaction, unknown trigger. Will treat with prednisone  taper, triamcinolone prn, antihistamines. Has f/u with PCP scheduled in 2 weeks for f/u. Return sooner if worsening.   Final Clinical Impressions(s) / UC Diagnoses   Final diagnoses:  Rash   Discharge Instructions   None    ED Prescriptions    Medication Sig  Dispense Auth. Provider   triamcinolone (KENALOG) 0.1 % Apply 1 application topically 2 (two) times daily. 60 g Particia Nearing, [place]   predniSONE (STERAPRED UNI-PAK 21 TAB) 10 MG (21) TBPK tablet Take 6 tabs day one, 5 tabs day two, 4 tabs day three, etc 21 tablet Particia Nearing, [place]     PDMP not reviewed this encounter.   Particia Nearing, [place] 08/20/20 1028

## 2020-09-04 ENCOUNTER — Other Ambulatory Visit: Payer: Self-pay | Admitting: Family Medicine

## 2020-09-04 DIAGNOSIS — I10 Essential (primary) hypertension: Secondary | ICD-10-CM

## 2020-09-04 NOTE — Telephone Encounter (Signed)
Requested medication (s) are due for refill today:   Yes  Requested medication (s) are on the active medication list:   Yes  Future visit scheduled:   Yes   Last ordered: 08/09/2020 #30, 0 refills  Clinic note:  Returned because pharmacy requesting a 90 day supply and a DX Code   Requested Prescriptions  Pending Prescriptions Disp Refills   amLODipine (NORVASC) 10 MG tablet [Pharmacy Med Name: AMLODIPINE BESYLATE 10 MG TAB] 90 tablet 1    Sig: TAKE 1 TABLET BY MOUTH EVERY DAY      Cardiovascular:  Calcium Channel Blockers Failed - 09/04/2020 10:18 AM      Failed - Last BP in normal range    BP Readings from Last 1 Encounters:  08/20/20 (!) 154/89          Failed - Valid encounter within last 6 months    Recent Outpatient Visits           9 months ago Bronchospasm   [facility] [place], [place], [place]   1 year ago Pain of left upper extremity   [facility] [place], Odette Horns, MD   2 years ago Rash and nonspecific skin eruption   [facility] [place], [place], [place]   3 years ago Essential hypertension   [facility] [place], Odette Horns, MD   3 years ago Tinea pedis of both feet   [facility] Hoy Register, MD       Future Appointments             In 3 days Hoy Register, MD [facility]

## 2020-09-07 ENCOUNTER — Ambulatory Visit: Payer: BC Managed Care – PPO | Attending: Family Medicine | Admitting: Family Medicine

## 2020-09-07 ENCOUNTER — Encounter: Payer: Self-pay | Admitting: Family Medicine

## 2020-09-07 ENCOUNTER — Other Ambulatory Visit: Payer: Self-pay

## 2020-09-07 VITALS — BP 147/72 | HR 72 | Ht 62.0 in | Wt 153.6 lb

## 2020-09-07 DIAGNOSIS — R7303 Prediabetes: Secondary | ICD-10-CM

## 2020-09-07 DIAGNOSIS — I10 Essential (primary) hypertension: Secondary | ICD-10-CM | POA: Diagnosis not present

## 2020-09-07 DIAGNOSIS — Z1159 Encounter for screening for other viral diseases: Secondary | ICD-10-CM | POA: Diagnosis not present

## 2020-09-07 DIAGNOSIS — R21 Rash and other nonspecific skin eruption: Secondary | ICD-10-CM

## 2020-09-07 DIAGNOSIS — E7800 Pure hypercholesterolemia, unspecified: Secondary | ICD-10-CM

## 2020-09-07 MED ORDER — AMLODIPINE BESYLATE 10 MG PO TABS: 10.0000 mg | ORAL_TABLET | Freq: Every day | ORAL | 6 refills | Status: DC

## 2020-09-07 NOTE — Progress Notes (Signed)
States that skin has been breaking out.

## 2020-09-07 NOTE — Progress Notes (Signed)
Subjective:  Patient ID: Grace [name], female    DOB: Mar 19, 1966  Age: 55 y.o. MRN: [id number]  CC: Hypertension   HPI Grace [name] is a 55 year old female with a history of Hypertension, Hypercholesterolemia and Prediabetes who presents today for a follow up visit.  2 months ago she started developing erthemtous rash in diffent parts of her body , on her legs, arms which would start out like a hive and would be itchy. They are little bumps with no discharge and would resolve without any intervention. She denies h/o allergic reaction.  She stopped Metformin which she she was prescribed for Prediabetes and never took Atorvastatin for her Hyperlipidemia. She would like to work on lifestyle changes.  Past Medical History:  Diagnosis Date  . Hypertension     History reviewed. No pertinent surgical history.  History reviewed. No pertinent family history.  No Known Allergies  Outpatient Medications Prior to Visit  Medication Sig Dispense Refill  . albuterol (VENTOLIN HFA) 108 (90 Base) MCG/ACT inhaler Inhale 1-2 puffs into the lungs every 4 (four) hours as needed for wheezing or shortness of breath (or cough). 18 g 1  . aspirin EC 81 MG tablet Take 1 tablet (81 mg total) by mouth every other day. Take 1 every other day 100 tablet 1  . amLODipine (NORVASC) 10 MG tablet TAKE 1 TABLET BY MOUTH EVERY DAY 30 tablet 0  . aspirin-acetaminophen-caffeine (EXCEDRIN MIGRAINE) 250-250-65 MG tablet Take 1 tablet by mouth every 6 (six) hours as needed for headache. (Patient not taking: Reported on 09/07/2020)    . atorvastatin (LIPITOR) 20 MG tablet Take 1 tablet (20 mg total) by mouth daily. (Patient not taking: Reported on 09/07/2020) 90 tablet 3  . cyclobenzaprine (FLEXERIL) 10 MG tablet TAKE 1 TABLET BY MOUTH TWICE A DAY AS NEEDED FOR MUSCLE SPASMS (Patient not taking: No sig reported) 30 tablet 0  . metFORMIN (GLUCOPHAGE) 500 MG tablet Take 1 tablet (500 mg total) by mouth 2 (two) times daily with a  meal. (Patient not taking: Reported on 09/07/2020) 180 tablet 3  . nystatin-triamcinolone (MYCOLOG II) cream Apply 1 application topically 2 (two) times daily. (Patient not taking: No sig reported) 30 g 6  . ondansetron (ZOFRAN ODT) 4 MG disintegrating tablet Take 1 tablet (4 mg total) by mouth every 8 (eight) hours as needed for nausea or vomiting. (Patient not taking: No sig reported) 15 tablet 0  . predniSONE (STERAPRED UNI-PAK 21 TAB) 10 MG (21) TBPK tablet Take 6 tabs day one, 5 tabs day two, 4 tabs day three, etc (Patient not taking: Reported on 09/07/2020) 21 tablet 0  . triamcinolone (KENALOG) 0.1 % Apply 1 application topically 2 (two) times daily. (Patient not taking: Reported on 09/07/2020) 60 g 0   No facility-administered medications prior to visit.     ROS Review of Systems General: negative for fever, weight loss, appetite change Eyes: no visual symptoms. ENT: no ear symptoms, no sinus tenderness, no nasal congestion or sore throat. Neck: no pain  Respiratory: no wheezing, shortness of breath, cough Cardiovascular: no chest pain, no dyspnea on exertion, no pedal edema, no orthopnea. Gastrointestinal: no abdominal pain, no diarrhea, no constipation Genito-Urinary: no urinary frequency, no dysuria, no polyuria. Hematologic: no bruising Endocrine: no cold or heat intolerance Neurological: no headaches, no seizures, no tremors Musculoskeletal: no joint pains, no joint swelling Skin: no pruritus, + rash. Psychological: no depression, no anxiety,    Objective:  BP (!) 147/72  Pulse 72   Ht _0  (1.575 m)   Wt 153 lb 9.6 oz (69.7 kg)   SpO2 100%   BMI 28.09 kg/m   BP/Weight 09/07/2020 08/20/2020 03/05/5620  Systolic BP 308 657 846  Diastolic BP 72 89 94  Wt. (Lbs) 153.6 - 153  BMI 28.09 - 27.98      Physical Exam Constitutional: normal appearing,  Eyes: PERRLA HEENT: Head is atraumatic, normal sinuses, normal oropharynx, normal appearing tonsils and palate, tympanic  membrane is normal bilaterally. Neck: normal range of motion, no thyromegaly, no JVD Cardiovascular: normal rate and rhythm, normal heart sounds, no murmurs, rub or gallop, no pedal edema Respiratory: Normal breath sounds, clear to auscultation bilaterally, no wheezes, no rales, no rhonchi Abdomen: soft, not tender to palpation, normal bowel sounds, no enlarged organs Musculoskeletal: Full ROM, no tenderness in joints Skin: warm and dry, R temple with a cluster of 3 nodules without punctum, no erythema or discharge Neurological: alert, oriented x3, cranial nerves I-XII grossly intact , normal motor strength, normal sensation. Psychological: normal mood.   CMP Latest Ref Rng & Units 12/08/2019 08/30/2018 07/29/2017  Glucose 65 - 99 mg/dL 91 132(H) 109(H)  BUN 6 - 24 mg/dL _1 Creatinine 0.57 - 1.00 mg/dL 0.87 0.75 0.74  Sodium 134 - 144 mmol/L 142 141 143  Potassium 3.5 - 5.2 mmol/L 4.5 3.2(L) 4.4  Chloride 96 - 106 mmol/L 104 102 100  CO2 20 - 29 mmol/L _2 Calcium 8.7 - 10.2 mg/dL 10.1 10.3 10.1  Total Protein 6.0 - 8.5 g/dL 7.9 8.6(H) 7.7  Total Bilirubin 0.0 - 1.2 mg/dL 0.3 0.5 0.2  Alkaline Phos 48 - 121 IU/L 69 58 64  AST 0 - 40 IU/L 19 39 14  ALT 0 - 32 IU/L _3 Lipid Panel     Component Value Date/Time   CHOL 221 (H) 12/08/2019 1452   TRIG 154 (H) 12/08/2019 1452   HDL 47 12/08/2019 1452   CHOLHDL 4.7 (H) 12/08/2019 1452   CHOLHDL 3.9 03/28/2016 0904   VLDL 17 03/28/2016 0904   LDLCALC 146 (H) 12/08/2019 1452    CBC    Component Value Date/Time   WBC 4.3 12/08/2019 1452   WBC 4.6 08/30/2018 1554   RBC 4.66 12/08/2019 1452   RBC 5.24 (H) 08/30/2018 1554   HGB 13.4 12/08/2019 1452   HCT 41.6 12/08/2019 1452   PLT 363 12/08/2019 1452   MCV 89 12/08/2019 1452   MCH 28.8 12/08/2019 1452   MCH 28.1 08/30/2018 1554   MCHC 32.2 12/08/2019 1452   MCHC 31.1 08/30/2018 1554   RDW 13.1 12/08/2019 1452   LYMPHSABS 1.9 12/08/2019 1452   MONOABS 0.2  02/23/2017 0716   EOSABS 0.1 12/08/2019 1452   BASOSABS 0.0 12/08/2019 1452    Lab Results  Component Value Date   HGBA1C 6.1 (H) 12/08/2019    The 10-year ASCVD risk score Mikey Bussing DC Jr., et al., 2013) is: 22.3%   Values used to calculate the score:     Age: 68 years     Sex: Female     Is Non-Hispanic African American: Yes     Diabetic: Yes     Tobacco smoker: No     Systolic Blood Pressure: 962 mmHg     Is BP treated: Yes     HDL Cholesterol: 47 mg/dL     Total Cholesterol: 221 mg/dL   Assessment & Plan:  1. Essential  hypertension Uncontrolled She would like to work on weight loss Counseled on blood pressure goal of less than 130/80, low-sodium, DASH diet, medication compliance, 150 minutes of moderate intensity exercise per week. Discussed medication compliance, adverse effects. - CMP14+EGFR - Lipid panel - amLODipine (NORVASC) 10 MG tablet; Take 1 tablet (10 mg total) by mouth daily.  Dispense: 30 tablet; Refill: 6  2. Need for hepatitis C screening test - HCV RNA quant rflx ultra or genotyp([facility])  3. Rash Dermatitis with unknown trigger Advised to use topical cream which she has  4. Prediabetes A1c of 6.1 in 12/2019 Not taking Metformin Will repeat A1c She would like to work on her lifestly - Hemoglobin A1c  5. Hypercholesterolemia She declines Statin use and would like to work on her lifestyle Her 10 yr ASCVD risk calculated above is incorrect as she is not diabetic - calculated as 10.0%   Meds ordered this encounter  Medications  . amLODipine (NORVASC) 10 MG tablet    Sig: Take 1 tablet (10 mg total) by mouth daily.    Dispense:  30 tablet    Refill:  6    Follow-up: Return in about 1 month (around 10/08/2020) for Complete physical exam.       Charlott Rakes, MD, FAAFP. [facility] [place], [place]   09/07/2020, 5:24 PM

## 2020-09-08 LAB — HEMOGLOBIN A1C
Est. average glucose Bld gHb Est-mCnc: 131 mg/dL
Hgb A1c MFr Bld: 6.2 % — ABNORMAL HIGH (ref 4.8–5.6)

## 2020-09-08 LAB — CMP14+EGFR: Globulin, Total: 2.8 g/dL (ref 1.5–4.5)

## 2020-09-09 LAB — CMP14+EGFR
ALT: 9 IU/L (ref 0–32)
AST: 16 IU/L (ref 0–40)
Albumin/Globulin Ratio: 1.8 (ref 1.2–2.2)
Albumin: 4.9 g/dL (ref 3.8–4.9)
Alkaline Phosphatase: 69 IU/L (ref 44–121)
BUN/Creatinine Ratio: 18 (ref 9–23)
BUN: 13 mg/dL (ref 6–24)
Bilirubin Total: 0.3 mg/dL (ref 0.0–1.2)
CO2: 22 mmol/L (ref 20–29)
Calcium: 10.4 mg/dL — ABNORMAL HIGH (ref 8.7–10.2)
Chloride: 104 mmol/L (ref 96–106)
Creatinine, Ser: 0.73 mg/dL (ref 0.57–1.00)
Glucose: 93 mg/dL (ref 65–99)
Potassium: 4.3 mmol/L (ref 3.5–5.2)
Sodium: 142 mmol/L (ref 134–144)
Total Protein: 7.7 g/dL (ref 6.0–8.5)
eGFR: 97 mL/min/{1.73_m2} (ref >59)

## 2020-09-09 LAB — LIPID PANEL
Chol/HDL Ratio: 4.1 ratio (ref 0.0–4.4)
Cholesterol, Total: 215 mg/dL — ABNORMAL HIGH (ref 100–199)
HDL: 52 mg/dL (ref >39)
LDL Chol Calc (NIH): 146 mg/dL — ABNORMAL HIGH (ref 0–99)
Triglycerides: 93 mg/dL (ref 0–149)
VLDL Cholesterol Cal: 17 mg/dL (ref 5–40)

## 2020-09-09 LAB — HCV RNA QUANT RFLX ULTRA OR GENOTYP: HCV Quant Baseline: NOT DETECTED IU/mL

## 2020-09-11 ENCOUNTER — Telehealth: Payer: Self-pay

## 2020-09-11 NOTE — Telephone Encounter (Signed)
-----   Message from Hoy Register, MD sent at 09/11/2020  7:44 AM EST ----- A1c is 6.2- Prediabetes, cholesterol is still elevated but she had requested to work on her lifestyle instead so we will repeat labs at her next visit

## 2020-09-11 NOTE — Telephone Encounter (Signed)
Patient name and DOB has been verified Patient was informed of lab results. Patient had no questions.  

## 2020-10-17 ENCOUNTER — Encounter: Payer: BC Managed Care – PPO | Admitting: Family Medicine

## 2020-11-20 ENCOUNTER — Encounter: Payer: BC Managed Care – PPO | Admitting: Family Medicine

## 2021-01-02 ENCOUNTER — Encounter: Payer: BC Managed Care – PPO | Admitting: Family Medicine

## 2021-09-04 LAB — COLOGUARD: Cologuard: NEGATIVE

## 2021-09-11 LAB — COLOGUARD: COLOGUARD: POSITIVE — AB

## 2022-06-19 ENCOUNTER — Encounter: Payer: Self-pay | Admitting: Family Medicine

## 2022-06-19 ENCOUNTER — Ambulatory Visit (INDEPENDENT_AMBULATORY_CARE_PROVIDER_SITE_OTHER): Payer: BC Managed Care – PPO | Admitting: Family Medicine

## 2022-06-19 VITALS — BP 145/81 | HR 66 | Temp 98.1°F | Resp 16 | Ht 62.0 in | Wt 154.0 lb

## 2022-06-19 DIAGNOSIS — R7303 Prediabetes: Secondary | ICD-10-CM | POA: Diagnosis not present

## 2022-06-19 DIAGNOSIS — I10 Essential (primary) hypertension: Secondary | ICD-10-CM

## 2022-06-19 DIAGNOSIS — E785 Hyperlipidemia, unspecified: Secondary | ICD-10-CM

## 2022-06-19 LAB — POCT GLYCOSYLATED HEMOGLOBIN (HGB A1C): HbA1c, POC (prediabetic range): 6.2 % (ref 5.7–6.4)

## 2022-06-19 MED ORDER — AMLODIPINE BESYLATE 10 MG PO TABS: 10.0000 mg | ORAL_TABLET | Freq: Every day | ORAL | 1 refills | Status: DC

## 2022-06-19 NOTE — Progress Notes (Signed)
New Patient Office Visit  Subjective    Patient ID: Grace [name], female    DOB: January 03, 1966  Age: 56 y.o. MRN: [id number]  CC:  Chief Complaint  Patient presents with   Establish Care    HPI Grace [name] presents to establish care with a new provider and for review of chronic med issues.. Patient denies acute complaints or concerns.    Outpatient Encounter Medications as of 06/19/2022  Medication Sig   [DISCONTINUED] amLODipine (NORVASC) 10 MG tablet Take 1 tablet (10 mg total) by mouth daily.   albuterol (VENTOLIN HFA) 108 (90 Base) MCG/ACT inhaler Inhale 1-2 puffs into the lungs every 4 (four) hours as needed for wheezing or shortness of breath (or cough). (Patient not taking: Reported on 06/19/2022)   amLODipine (NORVASC) 10 MG tablet Take 1 tablet (10 mg total) by mouth daily.   aspirin EC 81 MG tablet Take 1 tablet (81 mg total) by mouth every other day. Take 1 every other day   atorvastatin (LIPITOR) 20 MG tablet Take 1 tablet (20 mg total) by mouth daily. (Patient not taking: Reported on 09/07/2020)   [DISCONTINUED] aspirin-acetaminophen-caffeine (EXCEDRIN MIGRAINE) 250-250-65 MG tablet Take 1 tablet by mouth every 6 (six) hours as needed for headache. (Patient not taking: Reported on 09/07/2020)   [DISCONTINUED] cyclobenzaprine (FLEXERIL) 10 MG tablet TAKE 1 TABLET BY MOUTH TWICE A DAY AS NEEDED FOR MUSCLE SPASMS (Patient not taking: No sig reported)   [DISCONTINUED] metFORMIN (GLUCOPHAGE) 500 MG tablet Take 1 tablet (500 mg total) by mouth 2 (two) times daily with a meal. (Patient not taking: Reported on 09/07/2020)   [DISCONTINUED] nystatin-triamcinolone (MYCOLOG II) cream Apply 1 application topically 2 (two) times daily. (Patient not taking: No sig reported)   [DISCONTINUED] ondansetron (ZOFRAN ODT) 4 MG disintegrating tablet Take 1 tablet (4 mg total) by mouth every 8 (eight) hours as needed for nausea or vomiting. (Patient not taking: No sig reported)   [DISCONTINUED] predniSONE  (STERAPRED UNI-PAK 21 TAB) 10 MG (21) TBPK tablet Take 6 tabs day one, 5 tabs day two, 4 tabs day three, etc (Patient not taking: Reported on 09/07/2020)   [DISCONTINUED] triamcinolone (KENALOG) 0.1 % Apply 1 application topically 2 (two) times daily. (Patient not taking: Reported on 09/07/2020)   No facility-administered encounter medications on file as of 06/19/2022.    Past Medical History:  Diagnosis Date   Hypertension     History reviewed. No pertinent surgical history.  Family History  Problem Relation Age of Onset   Diabetes Maternal Grandmother    Hypertension Maternal Grandmother     Social History   Socioeconomic History   Marital status: Married    Spouse name: Not on file   Number of children: Not on file   Years of education: Not on file   Highest education level: Not on file  Occupational History   Not on file  Tobacco Use   Smoking status: Never   Smokeless tobacco: Never  Vaping Use   Vaping Use: Never used  Substance and Sexual Activity   Alcohol use: Yes    Comment: occasionally   Drug use: No   Sexual activity: Yes    Birth control/protection: None  Other Topics Concern   Not on file  Social History Narrative   Not on file   Social Determinants of Health   Financial Resource Strain: Not on file  Food Insecurity: Not on file  Transportation Needs: Not on file  Physical Activity: Not on file  Stress: Not on file  Social Connections: Not on file  Intimate Partner Violence: Not on file    Review of Systems  All other systems reviewed and are negative.       Objective    BP (!) 145/81 (BP Location: Right Arm, Patient Position: Sitting, Cuff Size: Normal)   Pulse 66   Temp 98.1 F (36.7 C) (Oral)   Resp 16   Ht 5\' 2"  (1.575 m)   Wt 154 lb (69.9 kg)   SpO2 99%   BMI 28.17 kg/m   Physical Exam Vitals and nursing note reviewed.  Constitutional:      General: She is not in acute distress. Cardiovascular:     Rate and Rhythm:  Normal rate and regular rhythm.  Pulmonary:     Effort: Pulmonary effort is normal.     Breath sounds: Normal breath sounds.  Abdominal:     Palpations: Abdomen is soft.     Tenderness: There is no abdominal tenderness.  Neurological:     General: No focal deficit present.     Mental Status: She is alert and oriented to person, place, and time.         Assessment & Plan:   1. Essential hypertension Discussed compliance, elevated reading. Will prescribed amlodipine 10 mgd daily and monitor - amLODipine (NORVASC) 10 MG tablet; Take 1 tablet (10 mg total) by mouth daily.  Dispense: 90 tablet; Refill: 1  2. Prediabetes A1c is stable and at goal. continue - HgB A1c  3. Hyperlipidemia, unspecified hyperlipidemia type Dietary and activity options discussed including fish oil.      Return in about 3 months (around 09/18/2022) for follow up, physical.   Becky Sax, MD

## 2022-08-05 ENCOUNTER — Other Ambulatory Visit: Payer: Self-pay | Admitting: Family Medicine

## 2022-08-05 DIAGNOSIS — Z1231 Encounter for screening mammogram for malignant neoplasm of breast: Secondary | ICD-10-CM

## 2022-09-25 ENCOUNTER — Encounter: Payer: BC Managed Care – PPO | Admitting: Family Medicine

## 2022-10-02 ENCOUNTER — Ambulatory Visit
Admission: RE | Admit: 2022-10-02 | Discharge: 2022-10-02 | Disposition: A | Discharge status: Home / Self Care | Payer: BC Managed Care – PPO | Source: Ambulatory Visit | Attending: Family Medicine | Admitting: Family Medicine

## 2022-10-02 DIAGNOSIS — Z1231 Encounter for screening mammogram for malignant neoplasm of breast: Secondary | ICD-10-CM

## 2022-11-14 ENCOUNTER — Other Ambulatory Visit (HOSPITAL_COMMUNITY)
Admission: RE | Admit: 2022-11-14 | Discharge: 2022-11-14 | Disposition: A | Discharge status: Home / Self Care | Payer: BC Managed Care – PPO | Source: Ambulatory Visit | Attending: Family Medicine | Admitting: Family Medicine

## 2022-11-14 ENCOUNTER — Encounter: Payer: Self-pay | Admitting: Family Medicine

## 2022-11-14 ENCOUNTER — Ambulatory Visit (INDEPENDENT_AMBULATORY_CARE_PROVIDER_SITE_OTHER): Payer: BC Managed Care – PPO | Admitting: Family Medicine

## 2022-11-14 VITALS — BP 137/81 | HR 69 | Temp 98.1°F | Ht 62.0 in | Wt 156.6 lb

## 2022-11-14 DIAGNOSIS — Z01419 Encounter for gynecological examination (general) (routine) without abnormal findings: Secondary | ICD-10-CM | POA: Diagnosis present

## 2022-11-14 DIAGNOSIS — Z0000 Encounter for general adult medical examination without abnormal findings: Secondary | ICD-10-CM

## 2022-11-14 DIAGNOSIS — Z13228 Encounter for screening for other metabolic disorders: Secondary | ICD-10-CM | POA: Diagnosis not present

## 2022-11-14 DIAGNOSIS — Z113 Encounter for screening for infections with a predominantly sexual mode of transmission: Secondary | ICD-10-CM | POA: Insufficient documentation

## 2022-11-14 DIAGNOSIS — Z1329 Encounter for screening for other suspected endocrine disorder: Secondary | ICD-10-CM | POA: Diagnosis not present

## 2022-11-14 DIAGNOSIS — Z130 Encounter for screening for diseases of the blood and blood-forming organs and certain disorders involving the immune mechanism: Secondary | ICD-10-CM

## 2022-11-14 DIAGNOSIS — Z13220 Encounter for screening for lipoid disorders: Secondary | ICD-10-CM | POA: Diagnosis not present

## 2022-11-14 DIAGNOSIS — Z1211 Encounter for screening for malignant neoplasm of colon: Secondary | ICD-10-CM

## 2022-11-15 LAB — CERVICOVAGINAL ANCILLARY ONLY
Bacterial Vaginitis (gardnerella): POSITIVE — AB
Candida Glabrata: NEGATIVE
Candida Vaginitis: NEGATIVE
Chlamydia: NEGATIVE
Comment: NEGATIVE
Comment: NEGATIVE
Comment: NEGATIVE
Comment: NEGATIVE
Comment: NEGATIVE
Comment: NORMAL
Neisseria Gonorrhea: NEGATIVE
Trichomonas: NEGATIVE

## 2022-11-15 LAB — CYTOLOGY - PAP: Diagnosis: NEGATIVE

## 2022-11-15 LAB — CBC WITH DIFFERENTIAL/PLATELET
Basophils Absolute: 0 10*3/uL (ref 0.0–0.2)
Basos: 0 %
EOS (ABSOLUTE): 0.1 10*3/uL (ref 0.0–0.4)
Eos: 1 %
Hematocrit: 38.8 % (ref 34.0–46.6)
Hemoglobin: 12.5 g/dL (ref 11.1–15.9)
Immature Grans (Abs): 0 10*3/uL (ref 0.0–0.1)
Immature Granulocytes: 0 %
Lymphocytes Absolute: 1.6 10*3/uL (ref 0.7–3.1)
Lymphs: 36 %
MCH: 28.7 pg (ref 26.6–33.0)
MCHC: 32.2 g/dL (ref 31.5–35.7)
MCV: 89 fL (ref 79–97)
Monocytes Absolute: 0.3 10*3/uL (ref 0.1–0.9)
Monocytes: 7 %
Neutrophils Absolute: 2.4 10*3/uL (ref 1.4–7.0)
Neutrophils: 56 %
Platelets: 352 10*3/uL (ref 150–450)
RBC: 4.36 x10E6/uL (ref 3.77–5.28)
RDW: 13.2 % (ref 11.7–15.4)
WBC: 4.4 10*3/uL (ref 3.4–10.8)

## 2022-11-15 LAB — CMP14+EGFR
ALT: 7 IU/L (ref 0–32)
AST: 16 IU/L (ref 0–40)
Albumin/Globulin Ratio: 1.6 (ref 1.2–2.2)
Albumin: 4.6 g/dL (ref 3.8–4.9)
Alkaline Phosphatase: 68 IU/L (ref 44–121)
BUN/Creatinine Ratio: 11 (ref 9–23)
BUN: 8 mg/dL (ref 6–24)
Bilirubin Total: 0.4 mg/dL (ref 0.0–1.2)
CO2: 23 mmol/L (ref 20–29)
Calcium: 10.1 mg/dL (ref 8.7–10.2)
Chloride: 103 mmol/L (ref 96–106)
Creatinine, Ser: 0.74 mg/dL (ref 0.57–1.00)
Globulin, Total: 2.9 g/dL (ref 1.5–4.5)
Glucose: 92 mg/dL (ref 70–99)
Potassium: 4.4 mmol/L (ref 3.5–5.2)
Sodium: 141 mmol/L (ref 134–144)
Total Protein: 7.5 g/dL (ref 6.0–8.5)
eGFR: 94 mL/min/{1.73_m2} (ref >59)

## 2022-11-15 LAB — LIPID PANEL
Chol/HDL Ratio: 4 ratio (ref 0.0–4.4)
Cholesterol, Total: 203 mg/dL — ABNORMAL HIGH (ref 100–199)
HDL: 51 mg/dL (ref >39)
LDL Chol Calc (NIH): 137 mg/dL — ABNORMAL HIGH (ref 0–99)
Triglycerides: 85 mg/dL (ref 0–149)
VLDL Cholesterol Cal: 15 mg/dL (ref 5–40)

## 2022-11-15 LAB — VITAMIN D 25 HYDROXY (VIT D DEFICIENCY, FRACTURES): Vit D, 25-Hydroxy: 14.7 ng/mL — ABNORMAL LOW (ref 30.0–100.0)

## 2022-11-15 LAB — TSH: TSH: 2.31 u[IU]/mL (ref 0.450–4.500)

## 2022-11-20 ENCOUNTER — Encounter: Payer: Self-pay | Admitting: Family Medicine

## 2022-11-20 ENCOUNTER — Other Ambulatory Visit: Payer: Self-pay | Admitting: Family Medicine

## 2022-11-20 MED ORDER — VITAMIN D (ERGOCALCIFEROL) 1.25 MG (50000 UNIT) PO CAPS: 50000.0000 [IU] | ORAL_CAPSULE | ORAL | 0 refills | Status: AC

## 2022-11-20 MED ORDER — METRONIDAZOLE 500 MG PO TABS: 500.0000 mg | ORAL_TABLET | Freq: Two times a day (BID) | ORAL | 0 refills | Status: AC

## 2022-11-20 NOTE — Progress Notes (Signed)
Establsihed Patient Office Visit  Subjective    Patient ID: Grace [name], female    DOB: 04-Jul-1966  Age: 57 y.o. MRN: [id number]  CC:  Chief Complaint  Patient presents with   Annual Exam    HPI Grace [name] presents for routine annual exam. Patient denies acute complaints or concerns.    Outpatient Encounter Medications as of 11/14/2022  Medication Sig   amLODipine (NORVASC) 10 MG tablet Take 1 tablet (10 mg total) by mouth daily.   albuterol (VENTOLIN HFA) 108 (90 Base) MCG/ACT inhaler Inhale 1-2 puffs into the lungs every 4 (four) hours as needed for wheezing or shortness of breath (or cough). (Patient not taking: Reported on 06/19/2022)   aspirin EC 81 MG tablet Take 1 tablet (81 mg total) by mouth every other day. Take 1 every other day (Patient not taking: Reported on 11/14/2022)   atorvastatin (LIPITOR) 20 MG tablet Take 1 tablet (20 mg total) by mouth daily. (Patient not taking: Reported on 09/07/2020)   No facility-administered encounter medications on file as of 11/14/2022.    Past Medical History:  Diagnosis Date   Hypertension     No past surgical history on file.  Family History  Problem Relation Age of Onset   Diabetes Maternal Grandmother    Hypertension Maternal Grandmother     Social History   Socioeconomic History   Marital status: Married    Spouse name: Not on file   Number of children: Not on file   Years of education: Not on file   Highest education level: Not on file  Occupational History   Not on file  Tobacco Use   Smoking status: Never   Smokeless tobacco: Never  Vaping Use   Vaping Use: Never used  Substance and Sexual Activity   Alcohol use: Yes    Comment: occasionally   Drug use: No   Sexual activity: Yes    Birth control/protection: None  Other Topics Concern   Not on file  Social History Narrative   Not on file   Social Determinants of Health   Financial Resource Strain: Not on file  Food Insecurity: Not on file   Transportation Needs: Not on file  Physical Activity: Not on file  Stress: Not on file  Social Connections: Not on file  Intimate Partner Violence: Not on file    Review of Systems  All other systems reviewed and are negative.       Objective    BP 137/81   Pulse 69   Temp 98.1 F (36.7 C) (Oral)   Ht 5\' 2"  (1.575 m)   Wt 156 lb 9.6 oz (71 kg)   SpO2 96%   BMI 28.64 kg/m   Physical Exam Vitals and nursing note reviewed.  Constitutional:      General: She is not in acute distress. HENT:     Head: Normocephalic and atraumatic.     Right Ear: Tympanic membrane, ear canal and external ear normal.     Left Ear: Tympanic membrane, ear canal and external ear normal.     Nose: Nose normal.     Mouth/Throat:     Mouth: Mucous membranes are moist.     Pharynx: Oropharynx is clear.  Eyes:     Conjunctiva/sclera: Conjunctivae normal.     Pupils: Pupils are equal, round, and reactive to light.  Neck:     Thyroid: No thyromegaly.  Cardiovascular:     Rate and Rhythm: Normal rate and regular rhythm.  Heart sounds: Normal heart sounds. No murmur heard. Pulmonary:     Effort: Pulmonary effort is normal. No respiratory distress.     Breath sounds: Normal breath sounds.  Abdominal:     General: There is no distension.     Palpations: Abdomen is soft. There is no mass.     Tenderness: There is no abdominal tenderness.  Musculoskeletal:        General: Normal range of motion.     Cervical back: Normal range of motion and neck supple.  Skin:    General: Skin is warm and dry.  Neurological:     General: No focal deficit present.     Mental Status: She is alert and oriented to person, place, and time.  Psychiatric:        Mood and Affect: Mood normal.        Behavior: Behavior normal.         Assessment & Plan:   1. Annual physical exam  - CMP14+EGFR  2. Screening for deficiency anemia  - CBC with Differential  3. Screening for lipid disorders  - Lipid  Panel  4. Screening for endocrine/metabolic/immunity disorders  - Vitamin D, 25-hydroxy - TSH  5. Screening for colon cancer  - Cologuard  6. Pap smear, as part of routine gynecological examination  - Cytology - PAP  7. Screening for STDs (sexually transmitted diseases)  - Cervicovaginal ancillary only    No follow-ups on file.   Tommie Raymond, MD

## 2022-11-21 ENCOUNTER — Other Ambulatory Visit: Payer: Self-pay | Admitting: Family Medicine

## 2022-11-21 ENCOUNTER — Other Ambulatory Visit: Payer: Self-pay | Admitting: *Deleted

## 2022-11-21 DIAGNOSIS — Z1211 Encounter for screening for malignant neoplasm of colon: Secondary | ICD-10-CM

## 2022-12-10 ENCOUNTER — Encounter: Payer: Self-pay | Admitting: Family Medicine

## 2022-12-12 ENCOUNTER — Other Ambulatory Visit: Payer: Self-pay | Admitting: Family Medicine

## 2022-12-12 DIAGNOSIS — I10 Essential (primary) hypertension: Secondary | ICD-10-CM

## 2022-12-20 ENCOUNTER — Encounter: Payer: Self-pay | Admitting: Internal Medicine

## 2023-01-14 ENCOUNTER — Ambulatory Visit (AMBULATORY_SURGERY_CENTER): Payer: BC Managed Care – PPO

## 2023-01-14 VITALS — Ht 62.0 in | Wt 154.0 lb

## 2023-01-14 DIAGNOSIS — Z1211 Encounter for screening for malignant neoplasm of colon: Secondary | ICD-10-CM

## 2023-01-14 MED ORDER — NA SULFATE-K SULFATE-MG SULF 17.5-3.13-1.6 GM/177ML PO SOLN
1.0000 | Freq: Once | ORAL | 0 refills | Status: AC
Start: 2023-01-14 — End: 2023-01-14

## 2023-01-14 NOTE — Progress Notes (Signed)
No egg or soy allergy known to patient  No issues known to pt with past sedation with any surgeries or procedures Patient denies ever being told they had issues or difficulty with intubation  No FH of Malignant Hyperthermia Pt is not on diet pills Pt is not on  home 02  Pt is not on blood thinners  Pt denies issues with constipation  No A fib or A flutter Have any cardiac testing pending--no  LOA: independent  Prep: suprep   Patient's chart reviewed by Cathlyn Parsons CNRA prior to previsit and patient appropriate for the [facility].  Previsit completed and red dot placed by patient's name on their procedure day (on provider's schedule).     PV competed with patient. Prep instructions sent to home address. Goodrx coupon for  [facility] to use for price reduction if needed.

## 2023-01-15 ENCOUNTER — Ambulatory Visit: Payer: Self-pay

## 2023-01-15 NOTE — Telephone Encounter (Signed)
  Pt called in, she is having a colonoscopy soon  and she is wanting to know will the anethesia affect her heart.      Chief Complaint: Asking if anesthesia with colonoscopy will affect her heart.  Symptoms: n/a Frequency: n/a Pertinent Negatives: Patient denies n/a Disposition: [] ED /[] Urgent Care (no appt availability in office) / [] Appointment(In office/virtual)/ []  [facility]/ [] Home Care/ [] Refused Recommended Disposition /[] [facility]/ [x]  Follow-up with PCP Additional Notes: Please advise pt.  Reason for Disposition  [1] Caller requests to speak ONLY to PCP AND [2] NON-URGENT question  Answer Assessment - Initial Assessment Questions 1. REASON FOR CALL or QUESTION: "What is your reason for calling today?" or "How can I best help you?" or "What question do you have that I can help answer?"     Pt. Asking if anesthesia with colonoscopy will affect her heart 2. CALLER: Document the source of call. (e.g., laboratory, patient).     Pt.  Protocols used: PCP Call - No Triage-A-AH

## 2023-01-21 NOTE — Telephone Encounter (Signed)
Att to contact pt to advise of provider response, no ans lvm

## 2023-02-05 ENCOUNTER — Encounter: Payer: BC Managed Care – PPO | Admitting: Internal Medicine

## 2023-02-19 ENCOUNTER — Ambulatory Visit: Payer: BC Managed Care – PPO | Admitting: Family Medicine

## 2023-02-19 ENCOUNTER — Encounter: Payer: Self-pay | Admitting: Family Medicine

## 2023-02-19 VITALS — BP 119/75 | HR 99 | Temp 98.1°F | Resp 16 | Wt 149.6 lb

## 2023-02-19 DIAGNOSIS — J4520 Mild intermittent asthma, uncomplicated: Secondary | ICD-10-CM

## 2023-02-19 DIAGNOSIS — E559 Vitamin D deficiency, unspecified: Secondary | ICD-10-CM

## 2023-02-19 DIAGNOSIS — E785 Hyperlipidemia, unspecified: Secondary | ICD-10-CM | POA: Diagnosis not present

## 2023-02-19 DIAGNOSIS — I10 Essential (primary) hypertension: Secondary | ICD-10-CM | POA: Diagnosis not present

## 2023-02-19 NOTE — Progress Notes (Signed)
Patient is here for their 3 month follow-up Patient has no concerns today Care gaps have been discussed with patient  

## 2023-02-20 LAB — CMP14+EGFR
ALT: 6 IU/L (ref 0–32)
AST: 11 IU/L (ref 0–40)
Albumin: 4.6 g/dL (ref 3.8–4.9)
Alkaline Phosphatase: 67 IU/L (ref 44–121)
BUN/Creatinine Ratio: 14 (ref 9–23)
BUN: 10 mg/dL (ref 6–24)
Bilirubin Total: 0.3 mg/dL (ref 0.0–1.2)
CO2: 23 mmol/L (ref 20–29)
Calcium: 9.7 mg/dL (ref 8.7–10.2)
Chloride: 103 mmol/L (ref 96–106)
Creatinine, Ser: 0.73 mg/dL (ref 0.57–1.00)
Globulin, Total: 3 g/dL (ref 1.5–4.5)
Glucose: 88 mg/dL (ref 70–99)
Potassium: 4.1 mmol/L (ref 3.5–5.2)
Sodium: 143 mmol/L (ref 134–144)
Total Protein: 7.6 g/dL (ref 6.0–8.5)
eGFR: 95 mL/min/{1.73_m2} (ref >59)

## 2023-02-20 LAB — LIPID PANEL
Chol/HDL Ratio: 3.6 ratio (ref 0.0–4.4)
Cholesterol, Total: 208 mg/dL — ABNORMAL HIGH (ref 100–199)
HDL: 58 mg/dL (ref >39)
LDL Chol Calc (NIH): 134 mg/dL — ABNORMAL HIGH (ref 0–99)
Triglycerides: 92 mg/dL (ref 0–149)
VLDL Cholesterol Cal: 16 mg/dL (ref 5–40)

## 2023-02-20 LAB — VITAMIN D 25 HYDROXY (VIT D DEFICIENCY, FRACTURES): Vit D, 25-Hydroxy: 31 ng/mL (ref 30.0–100.0)

## 2023-02-21 ENCOUNTER — Encounter: Payer: Self-pay | Admitting: Family Medicine

## 2023-02-21 NOTE — Progress Notes (Signed)
Established Patient Office Visit  Subjective    Patient ID: Grace [name], female    DOB: 10/09/1965  Age: 57 y.o. MRN: [id number]  CC:  Chief Complaint  Patient presents with   Follow-up   Hypertension    HPI [name] presents for follow up of chronic med issues. Patient denies acute complaints or concerns.    Outpatient Encounter Medications as of 02/19/2023  Medication Sig   amLODipine (NORVASC) 10 MG tablet TAKE 1 TABLET BY MOUTH EVERY DAY   Omega-3 Fatty Acids (FISH OIL OMEGA-3 PO) Take 2 capsules by mouth daily.   Vitamin D, Ergocalciferol, (DRISDOL) 1.25 MG (50000 UNIT) CAPS capsule Take 1 capsule (50,000 Units total) by mouth every 7 (seven) days.   albuterol (VENTOLIN HFA) 108 (90 Base) MCG/ACT inhaler Inhale 1-2 puffs into the lungs every 4 (four) hours as needed for wheezing or shortness of breath (or cough). (Patient not taking: Reported on 06/19/2022)   atorvastatin (LIPITOR) 20 MG tablet Take 1 tablet (20 mg total) by mouth daily. (Patient not taking: Reported on 09/07/2020)   No facility-administered encounter medications on file as of 02/19/2023.    Past Medical History:  Diagnosis Date   Hyperlipidemia    Hypertension     History reviewed. No pertinent surgical history.  Family History  Problem Relation Age of Onset   Diabetes Maternal Grandmother    Hypertension Maternal Grandmother    Colon polyps Paternal Grandmother    Colon cancer Neg Hx    Esophageal cancer Neg Hx    Rectal cancer Neg Hx    Stomach cancer Neg Hx     Social History   Socioeconomic History   Marital status: Married    Spouse name: Not on file   Number of children: Not on file   Years of education: Not on file   Highest education level: Not on file  Occupational History   Not on file  Tobacco Use   Smoking status: Never   Smokeless tobacco: Never  Vaping Use   Vaping status: Never Used  Substance and Sexual Activity   Alcohol use: Yes    Comment: occasionally    Drug use: No   Sexual activity: Yes    Birth control/protection: None  Other Topics Concern   Not on file  Social History Narrative   Not on file   Social Determinants of Health   Financial Resource Strain: Not on file  Food Insecurity: No Food Insecurity (02/19/2023)   Hunger Vital Sign    Worried About Running Out of Food in the Last Year: Never true    Ran Out of Food in the Last Year: Never true  Transportation Needs: Not on file  Physical Activity: Not on file  Stress: Not on file  Social Connections: Not on file  Intimate Partner Violence: Not on file    Review of Systems  All other systems reviewed and are negative.       Objective    BP 119/75   Pulse 99   Temp 98.1 F (36.7 C) (Oral)   Resp 16   Wt 149 lb 9.6 oz (67.9 kg)   SpO2 98%   BMI 27.36 kg/m   Physical Exam Vitals and nursing note reviewed.  Constitutional:      General: She is not in acute distress. Cardiovascular:     Rate and Rhythm: Normal rate and regular rhythm.  Pulmonary:     Effort: Pulmonary effort is normal.  Breath sounds: Normal breath sounds.  Abdominal:     Palpations: Abdomen is soft.     Tenderness: There is no abdominal tenderness.  Neurological:     General: No focal deficit present.     Mental Status: She is alert and oriented to person, place, and time.         Assessment & Plan:   1. Essential hypertension Appears stable. Continue  - Lipid Panel - CMP14+EGFR  2. Hyperlipidemia, unspecified hyperlipidemia type Continue. Monitoring labs ordered - Lipid Panel - CMP14+EGFR  3. Mild intermittent asthma without complication stable  4. Vitamin D deficiency Monitoring labs ordered - Vitamin D, 25-hydroxy    Return in about 3 months (around 05/22/2023) for follow up.   Grace Raymond, MD

## 2023-07-16 ENCOUNTER — Other Ambulatory Visit: Payer: Self-pay | Admitting: Family Medicine

## 2023-07-16 DIAGNOSIS — I10 Essential (primary) hypertension: Secondary | ICD-10-CM

## 2023-12-03 ENCOUNTER — Ambulatory Visit: Payer: BC Managed Care – PPO | Admitting: Family Medicine

## 2023-12-03 ENCOUNTER — Encounter: Payer: Self-pay | Admitting: Family Medicine

## 2023-12-03 DIAGNOSIS — E785 Hyperlipidemia, unspecified: Secondary | ICD-10-CM | POA: Diagnosis not present

## 2023-12-03 DIAGNOSIS — I10 Essential (primary) hypertension: Secondary | ICD-10-CM

## 2023-12-03 MED ORDER — AMLODIPINE BESYLATE 10 MG PO TABS
10.0000 mg | ORAL_TABLET | Freq: Every day | ORAL | 1 refills | Status: AC
Start: 2023-12-03 — End: ?

## 2023-12-03 MED ORDER — ATORVASTATIN CALCIUM 20 MG PO TABS
20.0000 mg | ORAL_TABLET | Freq: Every day | ORAL | 1 refills | Status: AC
Start: 2023-12-03 — End: ?

## 2023-12-03 NOTE — Progress Notes (Unsigned)
 Established Patient Office Visit  Subjective    Patient ID: Grace [name], female    DOB: 1965-11-18  Age: 58 y.o. MRN: [id number]  CC:  Chief Complaint  Patient presents with   Medical Management of Chronic Issues    HPI Grace [name] presents for routine follow up of chronic med issues including hypertension. Patient reports med compliance and denies acute complaints.   Outpatient Encounter Medications as of 12/03/2023  Medication Sig   [DISCONTINUED] amLODipine  (NORVASC ) 10 MG tablet TAKE 1 TABLET BY MOUTH EVERY DAY   albuterol  (VENTOLIN  HFA) 108 (90 Base) MCG/ACT inhaler Inhale 1-2 puffs into the lungs every 4 (four) hours as needed for wheezing or shortness of breath (or cough). (Patient not taking: Reported on 12/03/2023)   amLODipine  (NORVASC ) 10 MG tablet Take 1 tablet (10 mg total) by mouth daily.   atorvastatin  (LIPITOR) 20 MG tablet Take 1 tablet (20 mg total) by mouth daily.   Omega-3 Fatty Acids (FISH OIL OMEGA-3 PO) Take 2 capsules by mouth daily. (Patient not taking: Reported on 12/03/2023)   Vitamin D , Ergocalciferol , (DRISDOL ) 1.25 MG (50000 UNIT) CAPS capsule Take 1 capsule (50,000 Units total) by mouth every 7 (seven) days. (Patient not taking: Reported on 12/03/2023)   [DISCONTINUED] atorvastatin  (LIPITOR) 20 MG tablet Take 1 tablet (20 mg total) by mouth daily. (Patient not taking: Reported on 12/03/2023)   No facility-administered encounter medications on file as of 12/03/2023.    Past Medical History:  Diagnosis Date   Hyperlipidemia    Hypertension     No past surgical history on file.  Family History  Problem Relation Age of Onset   Diabetes Maternal Grandmother    Hypertension Maternal Grandmother    Colon polyps Paternal Grandmother    Colon cancer Neg Hx    Esophageal cancer Neg Hx    Rectal cancer Neg Hx    Stomach cancer Neg Hx     Social History   Socioeconomic History   Marital status: Married    Spouse name: Not on file   Number of  children: Not on file   Years of education: Not on file   Highest education level: Not on file  Occupational History   Not on file  Tobacco Use   Smoking status: Never   Smokeless tobacco: Never  Vaping Use   Vaping status: Never Used  Substance and Sexual Activity   Alcohol use: Yes    Comment: occasionally   Drug use: No   Sexual activity: Yes    Birth control/protection: None  Other Topics Concern   Not on file  Social History Narrative   Not on file   Social Drivers of Health   Financial Resource Strain: Not on file  Food Insecurity: No Food Insecurity (02/19/2023)   Hunger Vital Sign    Worried About Running Out of Food in the Last Year: Never true    Ran Out of Food in the Last Year: Never true  Transportation Needs: Not on file  Physical Activity: Not on file  Stress: Not on file  Social Connections: Not on file  Intimate Partner Violence: Not on file    Review of Systems  All other systems reviewed and are negative.       Objective    BP 131/79 (BP Location: Right Arm, Patient Position: Sitting, Cuff Size: Normal)   Pulse 83   Wt 143 lb 13.6 oz (65.2 kg)   SpO2 98%   BMI 26.31 kg/m  Physical Exam Vitals and nursing note reviewed.  Constitutional:      General: She is not in acute distress. Cardiovascular:     Rate and Rhythm: Normal rate and regular rhythm.  Pulmonary:     Effort: Pulmonary effort is normal.     Breath sounds: Normal breath sounds.  Abdominal:     Palpations: Abdomen is soft.     Tenderness: There is no abdominal tenderness.  Neurological:     General: No focal deficit present.     Mental Status: She is alert and oriented to person, place, and time.         Assessment & Plan:   Essential hypertension -     amLODIPine  Besylate; Take 1 tablet (10 mg total) by mouth daily.  Dispense: 90 tablet; Refill: 1  Hyperlipidemia, unspecified hyperlipidemia type -     Atorvastatin  Calcium ; Take 1 tablet (20 mg total) by mouth  daily.  Dispense: 90 tablet; Refill: 1   Appears stable. Meds refilled. Continue   No follow-ups on file.   Arlo Lama, MD

## 2023-12-04 ENCOUNTER — Encounter: Payer: Self-pay | Admitting: Family Medicine

## 2024-04-13 ENCOUNTER — Other Ambulatory Visit: Payer: Self-pay

## 2024-04-13 ENCOUNTER — Encounter (HOSPITAL_COMMUNITY): Payer: Self-pay

## 2024-04-13 ENCOUNTER — Emergency Department (HOSPITAL_COMMUNITY)
Admission: EM | Admit: 2024-04-13 | Discharge: 2024-04-13 | Disposition: A | Discharge status: Home / Self Care | Payer: Self-pay | Attending: Emergency Medicine | Admitting: Emergency Medicine

## 2024-04-13 ENCOUNTER — Emergency Department (HOSPITAL_COMMUNITY): Payer: Self-pay

## 2024-04-13 DIAGNOSIS — Z79899 Other long term (current) drug therapy: Secondary | ICD-10-CM | POA: Diagnosis not present

## 2024-04-13 DIAGNOSIS — Y92410 Unspecified street and highway as the place of occurrence of the external cause: Secondary | ICD-10-CM | POA: Diagnosis not present

## 2024-04-13 DIAGNOSIS — I10 Essential (primary) hypertension: Secondary | ICD-10-CM | POA: Insufficient documentation

## 2024-04-13 DIAGNOSIS — S39012A Strain of muscle, fascia and tendon of lower back, initial encounter: Secondary | ICD-10-CM | POA: Insufficient documentation

## 2024-04-13 DIAGNOSIS — M545 Low back pain, unspecified: Secondary | ICD-10-CM | POA: Diagnosis present
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
## 2024-04-13 NOTE — Discharge Instructions (Signed)
 The x-rays did not show any signs of fracture.  Take over-the-counter medication such as Tylenol ibuprofen  or topical lidocaine patches as needed for discomfort.  You can expect to be stiff and sore for the next few days but the symptoms should improve over the next week.
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
## 2024-06-07 ENCOUNTER — Encounter: Payer: Self-pay | Admitting: Family Medicine

## 2024-06-07 ENCOUNTER — Ambulatory Visit: Admitting: Family Medicine

## 2024-06-07 VITALS — BP 134/75 | HR 56 | Ht 62.0 in | Wt 144.2 lb

## 2024-06-07 DIAGNOSIS — J4520 Mild intermittent asthma, uncomplicated: Secondary | ICD-10-CM | POA: Diagnosis not present

## 2024-06-07 DIAGNOSIS — E785 Hyperlipidemia, unspecified: Secondary | ICD-10-CM

## 2024-06-07 DIAGNOSIS — I10 Essential (primary) hypertension: Secondary | ICD-10-CM

## 2024-06-10 NOTE — Progress Notes (Signed)
 Established Patient Office Visit  Subjective    Patient ID: Grace [name], female    DOB: 1966-05-11  Age: 58 y.o. MRN: [id number]  CC:  Chief Complaint  Patient presents with   Medical Management of Chronic Issues    HPI Grace [name] presents for routine follow up of hypertension and asthma. Patient reports med compliance and denies acute complaints.   Outpatient Encounter Medications as of 06/07/2024  Medication Sig   amLODipine  (NORVASC ) 10 MG tablet Take 1 tablet (10 mg total) by mouth daily.   albuterol  (VENTOLIN  HFA) 108 (90 Base) MCG/ACT inhaler Inhale 1-2 puffs into the lungs every 4 (four) hours as needed for wheezing or shortness of breath (or cough). (Patient not taking: Reported on 12/03/2023)   atorvastatin  (LIPITOR) 20 MG tablet Take 1 tablet (20 mg total) by mouth daily. (Patient not taking: Reported on 06/07/2024)   Omega-3 Fatty Acids (FISH OIL OMEGA-3 PO) Take 2 capsules by mouth daily. (Patient not taking: Reported on 12/03/2023)   Vitamin D , Ergocalciferol , (DRISDOL ) 1.25 MG (50000 UNIT) CAPS capsule Take 1 capsule (50,000 Units total) by mouth every 7 (seven) days. (Patient not taking: Reported on 12/03/2023)   No facility-administered encounter medications on file as of 06/07/2024.    Past Medical History:  Diagnosis Date   Hyperlipidemia    Hypertension     History reviewed. No pertinent surgical history.  Family History  Problem Relation Age of Onset   Diabetes Maternal Grandmother    Hypertension Maternal Grandmother    Colon polyps Paternal Grandmother    Colon cancer Neg Hx    Esophageal cancer Neg Hx    Rectal cancer Neg Hx    Stomach cancer Neg Hx     Social History   Socioeconomic History   Marital status: Married    Spouse name: Not on file   Number of children: Not on file   Years of education: Not on file   Highest education level: Not on file  Occupational History   Not on file  Tobacco Use   Smoking status: Never   Smokeless  tobacco: Never  Vaping Use   Vaping status: Never Used  Substance and Sexual Activity   Alcohol use: Yes    Comment: occasionally   Drug use: No   Sexual activity: Yes    Birth control/protection: None  Other Topics Concern   Not on file  Social History Narrative   Not on file   Social Drivers of Health   Financial Resource Strain: Low Risk  (06/07/2024)   Overall Financial Resource Strain (CARDIA)    Difficulty of Paying Living Expenses: Not hard at all  Food Insecurity: No Food Insecurity (02/19/2023)   Hunger Vital Sign    Worried About Running Out of Food in the Last Year: Never true    Ran Out of Food in the Last Year: Never true  Transportation Needs: No Transportation Needs (06/07/2024)   PRAPARE - Administrator, Civil Service (Medical): No    Lack of Transportation (Non-Medical): No  Physical Activity: Inactive (06/07/2024)   Exercise Vital Sign    Days of Exercise per Week: 0 days    Minutes of Exercise per Session: 0 min  Stress: No Stress Concern Present (06/07/2024)   [employer] of Occupational Health - Occupational Stress Questionnaire    Feeling of Stress: Not at all  Social Connections: Moderately Isolated (06/07/2024)   Social Connection and Isolation Panel    Frequency of Communication  with Friends and Family: More than three times a week    Frequency of Social Gatherings with Friends and Family: Twice a week    Attends Religious Services: Never    Database Administrator or Organizations: No    Attends Banker Meetings: Never    Marital Status: Married  Catering Manager Violence: Not At Risk (06/07/2024)   Humiliation, Afraid, Rape, and Kick questionnaire    Fear of Current or Ex-Partner: No    Emotionally Abused: No    Physically Abused: No    Sexually Abused: No    Review of Systems  All other systems reviewed and are negative.       Objective    BP 134/75   Pulse (!) 56   Ht 5' 2 (1.575 m)   Wt 144 lb 3.2 oz  (65.4 kg)   SpO2 98%   BMI 26.37 kg/m   Physical Exam Vitals and nursing note reviewed.  Constitutional:      General: She is not in acute distress. Cardiovascular:     Rate and Rhythm: Normal rate and regular rhythm.  Pulmonary:     Effort: Pulmonary effort is normal.     Breath sounds: Normal breath sounds.  Abdominal:     Palpations: Abdomen is soft.     Tenderness: There is no abdominal tenderness.  Neurological:     General: No focal deficit present.     Mental Status: She is alert and oriented to person, place, and time.         Assessment & Plan:   1. Essential hypertension (Primary) Appears stable. Continue   2. Hyperlipidemia, unspecified hyperlipidemia type Continue   3. Mild intermittent asthma without complication Continue   No follow-ups on file.   Tanda Raguel SQUIBB, MD

## 2024-12-06 ENCOUNTER — Ambulatory Visit: Admitting: Family Medicine
